# Patient Record
Sex: Female | Born: 1965 | Race: White | Hispanic: No | Marital: Married | Smoking: Current every day smoker
Health system: Southern US, Community
[De-identification: ages and names within clinical notes are randomized; demographics above are authoritative.]

## PROBLEM LIST (undated history)

## (undated) DIAGNOSIS — M1990 Unspecified osteoarthritis, unspecified site: Secondary | ICD-10-CM

## (undated) HISTORY — DX: Unspecified osteoarthritis, unspecified site: M19.90

---

## 2001-01-20 ENCOUNTER — Other Ambulatory Visit: Admission: RE | Admit: 2001-01-20 | Discharge: 2001-01-20 | Payer: Self-pay | Admitting: Obstetrics and Gynecology

## 2002-08-27 ENCOUNTER — Other Ambulatory Visit: Admission: RE | Admit: 2002-08-27 | Discharge: 2002-08-27 | Payer: Self-pay | Admitting: Obstetrics and Gynecology

## 2005-05-08 ENCOUNTER — Other Ambulatory Visit: Admission: RE | Admit: 2005-05-08 | Discharge: 2005-05-08 | Payer: Self-pay | Admitting: Obstetrics and Gynecology

## 2006-08-20 ENCOUNTER — Ambulatory Visit (HOSPITAL_COMMUNITY): Admission: RE | Admit: 2006-08-20 | Discharge: 2006-08-21 | Payer: Self-pay | Admitting: Obstetrics and Gynecology

## 2006-08-20 ENCOUNTER — Encounter (INDEPENDENT_AMBULATORY_CARE_PROVIDER_SITE_OTHER): Payer: Self-pay | Admitting: Obstetrics and Gynecology

## 2006-08-20 HISTORY — PX: ABDOMINAL HYSTERECTOMY: SHX81

## 2006-08-20 LAB — HM PAP SMEAR

## 2007-01-07 ENCOUNTER — Encounter (INDEPENDENT_AMBULATORY_CARE_PROVIDER_SITE_OTHER): Payer: Self-pay | Admitting: Internal Medicine

## 2007-01-08 ENCOUNTER — Ambulatory Visit: Payer: Self-pay | Admitting: Family Medicine

## 2007-01-08 ENCOUNTER — Encounter (INDEPENDENT_AMBULATORY_CARE_PROVIDER_SITE_OTHER): Payer: Self-pay | Admitting: Internal Medicine

## 2007-01-08 DIAGNOSIS — M7918 Myalgia, other site: Secondary | ICD-10-CM | POA: Insufficient documentation

## 2007-01-12 LAB — CONVERTED CEMR LAB
Anti Nuclear Antibody(ANA): NEGATIVE
BUN: 12 mg/dL (ref 6–23)
Basophils Absolute: 0.1 10*3/uL (ref 0.0–0.1)
Basophils Relative: 1 % (ref 0.0–1.0)
CO2: 30 meq/L (ref 19–32)
Chloride: 104 meq/L (ref 96–112)
GFR calc Af Amer: 118 mL/min
GFR calc non Af Amer: 98 mL/min
Glucose, Bld: 87 mg/dL (ref 70–99)
MCV: 85.6 fL (ref 78.0–100.0)
Monocytes Absolute: 0.7 10*3/uL (ref 0.2–0.7)
Monocytes Relative: 8 % (ref 3.0–11.0)
Potassium: 4.6 meq/L (ref 3.5–5.1)
RBC: 4.63 M/uL (ref 3.87–5.11)
Rhuematoid fact SerPl-aCnc: <20 intl units/mL — ABNORMAL LOW (ref 0.0–20.0)
Sed Rate: 20 mm/hr (ref 0–25)
Sodium: 141 meq/L (ref 135–145)

## 2007-02-02 ENCOUNTER — Ambulatory Visit: Payer: Self-pay | Admitting: Family Medicine

## 2007-02-02 LAB — CONVERTED CEMR LAB: Glucose, Urine, Semiquant: NEGATIVE

## 2009-01-06 ENCOUNTER — Ambulatory Visit: Payer: Self-pay | Admitting: Family Medicine

## 2009-01-06 LAB — CONVERTED CEMR LAB
Hemoglobin: 15.3 g/dL — ABNORMAL HIGH (ref 12.0–15.0)
MCHC: 35.3 g/dL (ref 30.0–36.0)
Neutrophils Relative %: 66.7 % (ref 43.0–77.0)
Platelets: 292 10*3/uL (ref 150.0–400.0)
RBC: 4.79 M/uL (ref 3.87–5.11)
TSH: 0.86 microintl units/mL (ref 0.35–5.50)

## 2009-02-03 ENCOUNTER — Encounter: Payer: Self-pay | Admitting: Family Medicine

## 2009-03-07 ENCOUNTER — Encounter (INDEPENDENT_AMBULATORY_CARE_PROVIDER_SITE_OTHER): Payer: Self-pay | Admitting: Internal Medicine

## 2009-03-24 ENCOUNTER — Ambulatory Visit: Payer: Self-pay | Admitting: Family Medicine

## 2009-03-24 DIAGNOSIS — L259 Unspecified contact dermatitis, unspecified cause: Secondary | ICD-10-CM | POA: Insufficient documentation

## 2009-05-18 ENCOUNTER — Encounter: Payer: Self-pay | Admitting: Family Medicine

## 2009-05-18 ENCOUNTER — Ambulatory Visit (HOSPITAL_COMMUNITY): Admission: RE | Admit: 2009-05-18 | Discharge: 2009-05-18 | Payer: Self-pay | Admitting: Rheumatology

## 2010-02-21 ENCOUNTER — Encounter: Payer: Self-pay | Admitting: Family Medicine

## 2010-04-17 NOTE — Letter (Signed)
Summary: [facility]  [facility]   Imported By: Lanelle Bal 03/20/2009 10:43:34  _____________________________________________________________________  External Attachment:    Type:   Image     Comment:   External Document

## 2010-04-17 NOTE — Letter (Signed)
Summary: [facility]  [facility]   Imported By: Lanelle Bal 05/30/2009 08:19:41  _____________________________________________________________________  External Attachment:    Type:   Image     Comment:   External Document

## 2010-04-17 NOTE — Assessment & Plan Note (Signed)
Summary: ?ALLERGIC REACTION/CLE   Vital Signs:  Patient profile:   45 year old female Height:      65 inches Weight:      207.75 pounds BMI:     34.70 Temp:     98.4 degrees F oral Pulse rate:   80 / minute Pulse rhythm:   regular BP sitting:   110 / 76  (left arm) Cuff size:   large  Vitals Entered By: Lewanda Rife LPN (March 24, 2009 10:21 AM)  CC:  ?allergic reaction. Started itching on 03/18/09 and stopped all meds..  History of Present Illness: Here for ? reaction to something, not sure what--onset since 03/18/2009--taking benadryl 25 at hs, helps some --rash all over that itches  Was started on Plaquinil and low dose prednisone for RA on 12/21 by Dr Corliss Skains, rheumatologist  She started old cipro for UTI sx--took x 3d--12/26-12/30/2010--break out started when taking cipro  new comforter--not feathers--started using 12/26 --new fabric softner  Allergies: 1)  ! Codeine Sulfate (Codeine Sulfate)  Review of Systems      See HPI  Physical Exam  General:  alert, well-developed, well-nourished, and well-hydrated.  NAD Skin:  eryth fine red rash on trunk, anterior and posterior,and arms, legs, neck--not on face --evidence of scratching on trunk and arms Psych:  normally interactive and slightly anxious.     Impression & Recommendations:  Problem # 1:  CONTACT DERMATITIS&OTHER ECZEMA DUE UNSPEC CAUSE (ICD-692.9) Assessment New etiology of rash is not clear--many variables: cipro, plaquinil, new linins will start on prednisone taper: 60x1, 50x1, 40x3, 30x3, 20x3days she is to call Dr Corliss Skains re meds see back if not improved in 5d will mark chart allergic to cipro--this may not be true, but not worth repeat of this reaction to remove all bed linens and make bed without new comforter--may try in a month when other variables reduced--understands Her updated medication list for this problem includes:    Prednisone 5 Mg Tabs (Prednisone) ..... One daily for three days  then 1/2 daily for 3 days    Benadryl 25 Mg Caps (Diphenhydramine hcl) ..... Otc as directed.    Prednisone 10 Mg Tabs (Prednisone) .Marland Kitchen... Ake 6 tabs today, then take 5 daily x 1, then take 4 daily for 3days, then take 3 daily x 3, then take 2 daily x 2  Problem # 2:  MYOSITIS (ICD-729.1) Assessment: Comment Only now under cate of Dr Corliss Skains, rheumatologist, she feels that she has RA and is being treated accordingly The following medications were removed from the medication list:    Naprosyn 250 Mg Tabs (Naproxen) ..... Otc as needed  Complete Medication List: 1)  Hydroxychloroquine Sulfate 200 Mg Tabs (Hydroxychloroquine sulfate) .... One tablet twice a day 2)  Ciprofloxacin Hcl 500 Mg Tabs (Ciprofloxacin hcl) .... One tablet twice a day for 7 days 3)  Prednisone 5 Mg Tabs (Prednisone) .... One daily for three days then 1/2 daily for 3 days 4)  Benadryl 25 Mg Caps (Diphenhydramine hcl) .... Otc as directed. 5)  Prednisone 10 Mg Tabs (Prednisone) .... Ake 6 tabs today, then take 5 daily x 1, then take 4 daily for 3days, then take 3 daily x 3, then take 2 daily x 2 Prescriptions: PREDNISONE 10 MG TABS (PREDNISONE) ake 6 tabs today, then take 5 daily x 1, then take 4 daily for 3days, then take 3 daily x 3, then take 2 daily x 2  #36 x 0   Entered and Authorized by:  Billie-Lynn Tyler Deis FNP   Signed by:   Gildardo Griffes FNP on 03/24/2009   Method used:   Electronically to        [facility] (retail)       [address]       [place], [place]  [address]       Ph: [phone]       Fax: [phone]   RxID:   [id number]   Current Allergies (reviewed today): ! CODEINE SULFATE (CODEINE SULFATE)

## 2010-04-19 NOTE — Letter (Signed)
Summary: [facility]  [facility]   Imported By: Lanelle Bal 03/27/2010 10:47:03  _____________________________________________________________________  External Attachment:    Type:   Image     Comment:   External Document

## 2010-08-03 NOTE — Discharge Summary (Signed)
[name], Mary Shaffer                 ACCOUNT NO.:  [id number]   MEDICAL RECORD NO.:  [id number]          PATIENT TYPE:  OIB   LOCATION:  9317                          FACILITY:  [facility]   PHYSICIAN:  Juluis Mire, M.D.   DATE OF BIRTH:  09/14/1965   DATE OF ADMISSION:  08/20/2006  DATE OF DISCHARGE:  08/21/2006                               DISCHARGE SUMMARY   ADMISSION DIAGNOSIS:  Abnormal uterine bleeding.   DISCHARGE DIAGNOSIS:  Abnormal uterine bleeding, with the addition of  pelvic adhesions.   PROCEDURE:  Laparoscopic-assisted vaginal hysterectomy with lysis of  adhesions and cystoscopy.  For complete history and physical, see  dictated note.   COURSE IN THE HOSPITAL:  The patient underwent the above-noted surgery.  At the time of laparoscopy, she did have adhesions between the ovaries  and the back of the uterus; these were freed up.  After we completed the  laparoscopic-assisted vaginal hysterectomy, did do cystoscopy to  evaluate the bladder and ureters.  There was no signs of injuries, and  both ureters were spilling blue-tinged urine.   Postoperatively did well.  Discharged home on first postoperative day.  At that time, she was afebrile with stable vital signs.  Abdomen soft,  nontender.  Bowel sounds were active.  All incisions were intact.  Her  Foley had been discontinued.  She was voiding without difficulty.  She  had no active vaginal bleeding.  She was tolerating a regular diet and  ambulating without difficulty.  Her hemoglobin was 12.1.   In terms of complications, none encountered during stay in hospital.  The patient discharged home in stable condition.   DISPOSITION:  Routine postoperative instructions were given.  She was to  avoid heavy lifting, vaginal entrance or driving a car.  She was to  watch for signs of infection, nausea, vomiting, increasing abdominal  pain, active vaginal bleeding, signs of deep venous thrombosis or  pulmonary embolus.  She will  be followed in the office in one week.  Medications at discharge is Tylox.      Juluis Mire, M.D.  Electronically Signed     JSM/MEDQ  D:  08/21/2006  T:  08/21/2006  Job:  [id number]

## 2010-08-03 NOTE — Op Note (Signed)
NAMEJOHNELL, LANDOWSKI                 ACCOUNT NO.:  [id number]   MEDICAL RECORD NO.:  [id number]          PATIENT TYPE:  AMB   LOCATION:  SDC                           FACILITY:  [facility]   PHYSICIAN:  Juluis Mire, M.D.   DATE OF BIRTH:  Mar 03, 1966   DATE OF PROCEDURE:  DATE OF DISCHARGE:                               OPERATIVE REPORT   PREOPERATIVE DIAGNOSES:  Abnormal uterine bleeding.   POSTOPERATIVE DIAGNOSES:  Abnormal uterine bleeding with pelvic  adhesions.   PROCEDURE:  1. Laparoscopic assisted vaginal hysterectomy.  2. Lysis of adhesions.  3. Cystoscopy.   SURGEON:  Juluis Mire, M.D.   ASSISTANT:  Duke Salvia. Marcelle Overlie, M.D.   ANESTHESIA:  General endotracheal.   ESTIMATED BLOOD LOSS:  300-400 cc.   PACKS AND DRAINS:  None.   INTRAOPERATIVE BLOOD REPLACED:  None.   COMPLICATIONS:  None.   INDICATIONS FOR PROCEDURE:  As noted in history and physical.   PROCEDURE AS FOLLOWS:  The patient was taken to the OR and placed in  supine position.  After adequate level of general endotracheal  anesthesia was obtained, the patient was placed dorsal lithotomy  position using the Allen stirrups.  Peritoneum and vagina prepped out  with Betadine and bladder was entered by catheterization.  Hulka  tenaculum was put in place and secured.  The patient was then draped in  sterile fields.  Subumbilical incision was made with a knife.  A Veress  needle was introduced into the abdominal cavity.  Air was inflated with  approximately 40 liters of carbon dioxide.  The operating laparoscope  was introduced.  There was no evidence of injury to adjacent organs.  A  5 mm trocar was then placed in the suprapubic area under direct  visualization.  Visualization revealed a normal appendix, upper abdomen  including liver and tip of the gallbladder were clear.  The uterus was  adherent posteriorly to both ovaries that were stuck along the back of  the uterus and the left one was somewhat adherent  to the left pelvic  sidewall.  The ovaries and tubes are otherwise unremarkable.  We put in  a 2nd, 5 mm trocar in the left lower quadrant after we visualized the  epigastric vessels. The uterus was then elevated.  Using sharp and some  blunt dissection we are able to free the ovaries from the attachment to  the uterus.  The right one was __INTACT________ and freed and the left  one still had some adhesions to the pelvic sidewall but we left those  intact.  At this point in time, we brought in the GYRUS.  The right  utero-ovarian pedicle was cauterized, incised; the right round ligament  was cauterized, incised.  Then the left utero-ovarian pedicle was  cauterized and incised and the left round ligament was cauterized and  incised.  We had good relief bilaterally and we had good hemostasis. It  was noted that the bladder was densely adherent to the lower uterine  segment, at least it looked that way laparoscopically.   At this point in time  the laparoscope was removed.  The abdomen was  deflated with carbon dioxide. The legs were repositioned.  The Hulka  tenaculum was then removed.  The weight speculum was placed in the  vaginal vault.  Cervix was grasped with Jacob's tenaculum. Cul de sac  was entered sharply. Both uterosacral ligaments were clamped, cut and  suture ligated with 0-Vicryl.  The reflection of the vaginal mucosa  anteriorly was incised and the bladder was dissected superiorly.  Paracervical tissue was clamped, cut and suture ligated with 0-Vicryl.  Using the clamp, cut and tie technique we sutured the gutters with 0-  Vicryl. The parametrium was serially separated from the side of the  uterus.  We were able eventually identify the vesico-uterine space and  entered the peritoneum and put a retractor in place.  The uterus was  then flipped. The remaining pedicles were clamped and cut. The uterus  and cervix were passed off the operative field and sent to pathology.  Held  pedicles were secured with free ties of 0-Vicryl.  A uterosacral  plication stitch of 0-Vicryl was put in place and secured.  The vaginal  mucosa was reapproximated in vertical fashion after interrupted figure  of 8's of  0-Monocryl.  Because of the adhesions and the dissection we decided to  cystoscope the patient. She was given Indigo-Carmine.  The cystoscope  was introduced.  Both ureteral orifices were identified and noted to be  spilling blue tinged urine.  She did have some areas of mucosal  disruption posteriorly but it did not appear to go all the way through  the bladder. This may have been during the blunt dissection.  We  visualized and there was no spilling of blue tinged dye into the pelvis  or into the vaginal mucosa.  Again, we cold visualize easily and this  was just little tears in the mucosa.  At this point in time, the  cystoscope was removed. Foley was placed with straight drain.  The  patient's legs were positioned.  Laparoscope was re-introduced.  Visualization revealed actually excellent hemostasis at cuff and  ovaries.  We thoroughly irrigated the pelvis.  At this point in time,  all ligation was removed.  The abdomen was deflated of its carbon  dioxide.  All trocars were removed.  Subumbilical incision was closed  with interrupted,  subarticulars 4-0 Vicryl,  suprapubic incisions  closed with Dermabond.  The patient was taken out of the dorsal  lithotomy position.  Once alert and extubated transferred to recovery  room in good condition.  Sponge, instrument and needle counts reported  as correct by circulating nurse x2.      Juluis Mire, M.D.  Electronically Signed     JSM/MEDQ  D:  08/20/2006  T:  08/20/2006  Job:  [id number]

## 2010-08-03 NOTE — H&P (Signed)
NAMEMARLYN, Mary Shaffer                 ACCOUNT NO.:  [id number]   MEDICAL RECORD NO.:  [id number]          PATIENT TYPE:  AMB   LOCATION:  SDC                           [facility]ACILITY:  [facility]   PHYSICIAN:  Juluis Mire, M.D.   DATE OF BIRTH:  1966-03-07   DATE OF ADMISSION:  08/20/2006  DATE OF DISCHARGE:                              HISTORY & PHYSICAL   The patient is a 45 year old gravida 1, para 1 female who presents for  laparoscopic-assisted vaginal hysterectomy.   In relationship to the present admission, the patient has had increasing  menstrual  irregularities with associated menorrhagia.  Cycle pattern  and amount of flow has become a problem for the patient.  We are unable  to use birth control pills as she does continue to  have tobacco use.  We did a saline-infusion ultrasound and endometrial biopsy, both of  which were negative.  She did have some findings suggestive of  adenomyosis.  We tried cycling with Prometrium which we were  unsuccessful with and she now presents for laparoscopic-assisted vaginal  hysterectomy.  Ablation would be contraindicated in view of  irregularity.   ALLERGIES:  ALLERGIC TO CODEINE.   MEDICATIONS:  None.   PAST MEDICAL HISTORY:  Usual childhood diseases.  No significant  sequelae.   PAST SURGICAL HISTORY:  She has had one cesarean section.   FAMILY HISTORY:  Noncontributory.   SOCIAL HISTORY:  Social history does reveal 1-1/2 packs per day tobacco  use for the past 20 years.  No alcohol use.   REVIEW OF SYSTEMS:  Noncontributory.   PHYSICAL EXAMINATION:  VITAL SIGNS:  On physical exam, the patient is  afebrile with stable vital signs.  HEENT:  On exam, the patient is normocephalic.  Pupils equal, round, and  reactive to light and accommodation.  Extraocular movements were intact.  Sclerae and conjunctivae are clear.  Oropharynx clear.  NECK:  Without thyromegaly.  BREASTS:  No discrete masses.  LUNGS:  Clear.  CARDIAC SYSTEM:  Regular  in rate without murmurs or gallops.  ABDOMINAL EXAM:  Benign; no mass, organomegaly, or tenderness.  PELVIC:  Normal external genitalia.  Vaginal mucosa is clear.  Cervix  unremarkable.  Uterus feels to be normal size, shape, and contour.  Adnexa free of masses or tenderness.  EXTREMITIES:  Trace edema.  NEUROLOGIC:  Exam is grossly within normal limits.   IMPRESSION:  Abnormal uterine bleeding with associated menorrhagia  unresponsive to conservative management.   PLAN:  Again, we have discussed options.  We could continue to try to  cycle progesterone agents.  I have reviewed her tobacco use.  Her  hormonal options are limited.  Ablation is limited due to irregularity.  Therefore, we will proceed with laparoscopic-assisted vaginal  hysterectomy.  The risks of surgery were explained including the risk of  infection.  The risk of hemorrhage that could require transfusion with  the risk of AIDS or hepatitis.  The risk of injury to adjacent organs  including bladder, bowel, or ureters that could require further  exploratory surgery.  Risk of deep venous  thrombosis and pulmonary  embolus.  The patient expressed understanding of indications and risks.      Juluis Mire, M.D.  Electronically Signed     JSM/MEDQ  D:  08/20/2006  T:  08/20/2006  Job:  [id number]

## 2011-01-03 LAB — CBC
HCT: 38.5
Hemoglobin: 13
MCHC: 33.9
MCHC: 34.8
MCV: 88.9
Platelets: 293
RBC: 4.39
RDW: 13.1
RDW: 13.3
WBC: 16.3 — ABNORMAL HIGH
WBC: 8.4

## 2011-01-03 LAB — HCG, SERUM, QUALITATIVE: Preg, Serum: NEGATIVE

## 2013-08-03 ENCOUNTER — Other Ambulatory Visit: Payer: Self-pay | Admitting: Dermatology

## 2015-08-17 ENCOUNTER — Ambulatory Visit (INDEPENDENT_AMBULATORY_CARE_PROVIDER_SITE_OTHER): Payer: Commercial Managed Care - HMO | Admitting: Family Medicine

## 2015-08-17 ENCOUNTER — Encounter: Payer: Self-pay | Admitting: Family Medicine

## 2015-08-17 VITALS — BP 120/79 | HR 55 | Ht 65.25 in | Wt 211.4 lb

## 2015-08-17 DIAGNOSIS — E669 Obesity, unspecified: Secondary | ICD-10-CM | POA: Diagnosis not present

## 2015-08-17 DIAGNOSIS — Z720 Tobacco use: Secondary | ICD-10-CM | POA: Insufficient documentation

## 2015-08-17 DIAGNOSIS — Z1231 Encounter for screening mammogram for malignant neoplasm of breast: Secondary | ICD-10-CM

## 2015-08-17 DIAGNOSIS — M069 Rheumatoid arthritis, unspecified: Secondary | ICD-10-CM | POA: Diagnosis not present

## 2015-08-17 DIAGNOSIS — E663 Overweight: Secondary | ICD-10-CM | POA: Insufficient documentation

## 2015-08-17 DIAGNOSIS — Z716 Tobacco abuse counseling: Secondary | ICD-10-CM | POA: Diagnosis not present

## 2015-08-17 NOTE — Progress Notes (Signed)
Mary Shaffer, D.O. Family Medicine Physician [facility] Location: Primary Care at [facility]     Subjective:    CC: New pt, here to establish care.   HPI: Mary Shaffer is a pleasant 49 y.o. female who presents to [facility] at [facility] today To become established.  Her primary concern today is her weight.  She says she is the heaviest she has ever been and this makes her very unhappy. Approximately 8 years ago she had lost a bunch of weight by cutting back on the wrong foods and moving more. She wishes to do the same and wants to be held accountable.  She admittedly drinks tons of regular Dr. Reino Kent and a lot of fried and fast foods.  RA: Had seen Dr. Lendon Colonel in the past. She was on methotrexate, folic acid, meloxicam but felt that that made her feel worse then she does now and even have more flares. She has not seen a rheumatologist for over 2 years now. Her symptoms are well controlled per patient and she only gets flares one time per week or even some times once a month.  She uses Advil when necessary during these times at appropriate doses which works well.  She has not been exercising and says that in the past this was one of the most important thing she did to make herself feel better.  Past Medical History  Diagnosis Date  . Arthritis     rheumatoid    Past Surgical History  Procedure Laterality Date  . Abdominal hysterectomy  08/20/2006    Family History  Problem Relation Age of Onset  . Alcohol abuse Brother   . Hypertension Son   . Hypertension Maternal Grandmother   . Diabetes Maternal Grandfather   . Rheum arthritis Paternal Grandmother     History  Drug Use No  ,  History  Alcohol Use No  ,  History  Smoking status  . Current Every Day Smoker -- 1.00 packs/day  . Types: Cigarettes  Smokeless tobacco  . Never Used  ,  History  Sexual Activity  . Sexual Activity: Yes    Comment: hysterectomy    Patient's  Medications  New Prescriptions   No medications on file  Previous Medications   IBUPROFEN (ADVIL,MOTRIN) 200 MG TABLET    Take 400 mg by mouth every 6 (six) hours as needed.  Modified Medications   No medications on file  Discontinued Medications   No medications on file    ALLERGIES: Codeine sulfate and Sulfa antibiotics   Review of Systems: Full 14 point ROS performed via "adult medical history form".  Negative except for noted above    Objective:   Blood pressure 120/79, pulse 55, height 5' 5.25" (1.657 m), weight 211 lb 6.4 oz (95.89 kg). Body mass index is 34.92 kg/(m^2).  General: Well Developed, well nourished, and in no acute distress.  Neuro: Alert and oriented x3, extra-ocular muscles intact, sensation grossly intact.  HEENT: Normocephalic, atraumatic, pupils equal round reactive to light, neck supple, no gross masses, no carotid bruits, no JVD apprec Skin: no gross suspicious lesions or rashes  Cardiac: Regular rate and rhythm, no murmurs rubs or gallops.  Respiratory: Essentially clear to auscultation bilaterally. Not using accessory muscles, speaking in full sentences.  Abdominal: Soft, not grossly distended Musculoskeletal: Ambulates w/o diff, FROM * 4 ext.  Vasc: less 2 sec cap RF, warm and pink  Psych:  No HI/SI, judgement and insight good.  Impression and Recommendations:    The patient was counselled, risk factors were discussed, anticipatory guidance given.  No problem-specific assessment & plan notes found for this encounter.  Pt was seen in the office today for 45+ minutes, with over 50% time spent in face the face counseling and coordination of care  Rheumatoid arthritis (HCC) Cc Dr. Lendon Colonel but has not been there in over 2 years. Since symptoms well-controlled now we will manage with Advil and exercise. If gets worse patient understands she will need to return to the rheumatologist.  Tobacco abuse Patient without interest in quitting at this  time.  Obesity (BMI 30-39.9) Extensive counseling    Obesity:  Pt's goal- cut out caloric drinks, quit breads, fried foods, sweets.  Exercise- Walking T,TH,Sat- * 1 wk then next week    Extensive weight loss counseling including dietary changes and physical activity.  Follow-up 1 month for weight loss.  HealthMaintenance:  CBC, CMP, fasting lipid profile, TSH, A1c, vitamin D, B12 in the near future.  Patient wishes to have a complete physical exam in the very near future as it's been many years since her last one.  Explained that we like to focus on health maintenance issues during her yearly physical and not focus on her chronic diseases.   Note: This document was prepared using Dragon voice recognition software and may include unintentional dictation errors.

## 2015-08-17 NOTE — Assessment & Plan Note (Signed)
Patient without interest in quitting at this time.

## 2015-08-17 NOTE — Assessment & Plan Note (Signed)
Extensive counseling.   

## 2015-08-17 NOTE — Patient Instructions (Addendum)
Pt's goal- cut out caloric drinks, quit breads, fried foods, sweets.  Exercise- Walking T,TH,Sat- * 1 wk then next week.  F/up 1 mo.    -->  Obesity Obesity is defined as having too much total body fat and a body mass index (BMI) of 30 or more. BMI is an estimate of body fat and is calculated from your height and weight. BMI is typically calculated by your health care provider during regular wellness visits. Obesity happens when you consume more calories than you can burn by exercising or performing daily physical tasks. Prolonged obesity can cause major illnesses or emergencies, such as:  Stroke.  Heart disease.  Diabetes.  Cancer.  Arthritis.  High blood pressure (hypertension).  High cholesterol.  Sleep apnea.  Erectile dysfunction.  Infertility problems. CAUSES   Regularly eating unhealthy foods.  Physical inactivity.  Certain disorders, such as an underactive thyroid (hypothyroidism), Cushing's syndrome, and polycystic ovarian syndrome.  Certain medicines, such as steroids, some depression medicines, and antipsychotics.  Genetics.  Lack of sleep. DIAGNOSIS A health care provider can diagnose obesity after calculating your BMI. Obesity will be diagnosed if your BMI is 30 or higher. There are other methods of measuring obesity levels. Some other methods include measuring your skinfold thickness, your waist circumference, and comparing your hip circumference to your waist circumference. TREATMENT  A healthy treatment program includes some or all of the following:  Long-term dietary changes.  Exercise and physical activity.  Behavioral and lifestyle changes.  Medicine only under the supervision of your health care provider. Medicines may help, but only if they are used with diet and exercise programs. If your BMI is 40 or higher, your health care provider may recommend specialized surgery or programs to help with weight loss. An unhealthy treatment  program includes:  Fasting.  Fad diets.  Supplements and drugs. These choices do not succeed in long-term weight control. HOME CARE INSTRUCTIONS  Exercise and perform physical activity as directed by your health care provider. To increase physical activity, try the following:  Use stairs instead of elevators.  Park farther away from store entrances.  Garden, bike, or walk instead of watching television or using the computer.  Eat healthy, low-calorie foods and drinks on a regular basis. Eat more fruits and vegetables. Use low-calorie cookbooks or take healthy cooking classes.  Limit fast food, sweets, and processed snack foods.  Eat smaller portions.  Keep a daily journal of everything you eat. There are many free websites to help you with this. It may be helpful to measure your foods so you can determine if you are eating the correct portion sizes.  Avoid drinking alcohol. Drink more water and drinks without calories.  Take vitamins and supplements only as recommended by your health care provider.  Weight-loss support groups, Government social research officer, counselors, and stress reduction education can also be very helpful. SEEK IMMEDIATE MEDICAL CARE IF:  You have chest pain or tightness.  You have trouble breathing or feel short of breath.  You have weakness or leg numbness.  You feel confused or have trouble talking.  You have sudden changes in your vision.   This information is not intended to replace advice given to you by your health care provider. Make sure you discuss any questions you have with your health care provider.   Document Released: 04/11/2004 Document Revised: 03/25/2014 Document Reviewed: 04/10/2011 Elsevier Interactive Patient Education 2016 [employer]. Eating Healthy on a Budget There are many ways to save money at  the grocery store and continue to eat healthy. You can be successful if you plan your meals according to your budget, purchase according  to your budget and grocery list, and prepare food yourself.  How can I buy more food on a limited budget? Plan  Plan meals and snacks according to a grocery list and budget you create.  Look for recipes where you can cook once and make enough food for two meals.  Include meals that will "stretch" more expensive foods such as stews, casseroles, and stir-fry dishes.  Make a grocery list and make sure to bring it with you to the store. If you have a smart phone, you could use your phone to create your shopping list. Purchase  When grocery shopping, buy only the items on your grocery list and go only to the areas of the store that have the items on your list. Prepare  Some meal items can be prepared in advance. Pre-cook on days when you have extra time.  Make extra food (such as by doubling recipes) and freeze the extras in meal-sized containers or in individual portions for fast meals and snacks.  Use leftovers in your meal plan for the week.  Try some meatless meals or try "no cook" meals like salads.  When you come home from the grocery store, wash and prepare your fruits and vegetables so they are ready to use and eat. This will help reduce food waste. How can I buy more food on a limited budget?  Try these tips the next time you go shopping:   [place] store brands or generic brands.  Use coupons only for foods and brands you normally buy. Avoid buying items you wouldn't normally buy simply because they are on sale.  Check online and in newspapers for weekly deals.  Buy healthy items from the bulk bins when available, such as herbs, spices, flours, pastas, nuts, and dried fruit.  Buy fruits and vegetables that are in season. Prices are usually lower on in-season produce.  Compare and contrast different items. You can do this by looking at the unit price on the price tag. Use it to compare different brands and sizes to find out which item is the best deal.  Choose naturally  low-cost healthy items, such as carrots, potatoes, apples, bananas, and oranges. Dried or canned beans are a low-cost protein source.  Buy in bulk and freeze extra food. Items you can buy in bulk include meats, fish, poultry, frozen fruits, and frozen vegetables.  Limit the purchase of prepared or "ready-to-eat" foods, such as pre-cut fruits and vegetables and pre-made salads.  If possible, shop around to discover which grocery store offers the best prices. Some stores charge much more than other stores for the same items.  Do not shop when you are hungry. If you shop while hungry, It may be hard to stick to your list and budget.  Stick to your list and resist impulse buys. Treat your list as your official plan for the week.  Buy a variety of vegetables and fruit by purchasing fresh, frozen, and canned items.  Look beyond eye level. Foods at eye level (adult or child eye level) are more expensive. Look at the top and bottom shelves for deals.  Be efficient with your time when shopping. The more time you spend at the store, the more money you are likely to spend.  Consider other retailers such as dollar stores, larger [employer], local fruit and vegetable stands, and farmers markets.  What are some tips for less expensive food substitutions? When choosing more expensive foods like meats and dairy, try these tips to save money:  Choose cheaper cuts of meat, such as bone-in chicken thighs and drumsticks instead skinless and boneless chicken. When you are ready to prepare the chicken, you can remove the skin yourself to make it healthier.  Choose lean meats like chicken or [place]. When choosing ground beef, make sure it is lean ground beef (92% lean, 8% fat). If you do buy a fattier ground beef, drain the fat before eating.  Buy dried beans and peas, such as lentils, split peas, or kidney beans.  For seafood, choose canned tuna, salmon, or sardines.  Eggs are a low-cost source of  protein.  Buy the larger tubs of yogurt instead of individual-sized containers.  Choose water instead of sodas and other sweetened beverages.  Skip buying chips, cookies, and other "junk food". These items are usually expensive, high in calories, and low in nutritional value. How can I prepare the foods I buy in the healthiest way? Practice these tips for cooking foods in the healthiest way to reduce excess fat and calorie intake:  Steam, saute, grill, or bake foods instead of frying them.  Make sure half your plate is filled with fruits or vegetables. Choose from fresh, frozen, or canned fruits and vegetables. If eating canned, remember to rinse them before eating. This will remove any excess salt added for packaging.  Trim all fat from meat before cooking. Remove the skin from chicken or [place].  Spoon off fat from meat dishes once they have been chilled in the refrigerator and the fat has hardened on the top.  Use skim milk, low-fat milk, or evaporated skim milk when making cream sauces, soups, or puddings.  Substitute low-fat yogurt, sour cream, or cottage cheese for sour cream and mayonnaise in dips and dressings.  Try lemon juice, herbs, or spices to season food instead of salt, butter, or margarine.   This information is not intended to replace advice given to you by your health care provider. Make sure you discuss any questions you have with your health care provider.   Document Released: 11/05/2013 Document Reviewed: 11/05/2013 Elsevier Interactive Patient Education [employer]  Exercising to [employer] Exercising regularly is important. It has many health benefits, such as:  Improving your overall fitness, flexibility, and endurance.  Increasing your bone density.  Helping with weight control.  Decreasing your body fat.  Increasing your muscle strength.  Reducing stress and tension.  Improving your overall health. In order to become healthy and stay  healthy, it is recommended that you do moderate-intensity and vigorous-intensity exercise. You can tell that you are exercising at a moderate intensity if you have a higher heart rate and faster breathing, but you are still able to hold a conversation. You can tell that you are exercising at a vigorous intensity if you are breathing much harder and faster and cannot hold a conversation while exercising. HOW OFTEN SHOULD I EXERCISE? Choose an activity that you enjoy and set realistic goals. Your health care provider can help you to make an activity plan that works for you. Exercise regularly as directed by your health care provider. This may include:   Doing resistance training twice each week, such as:  Push-ups.  Sit-ups.  Lifting weights.  Using resistance bands.  Doing a given intensity of exercise for a given amount of time. Choose from these options:  150 minutes of moderate-intensity  exercise every week.  75 minutes of vigorous-intensity exercise every week.  A mix of moderate-intensity and vigorous-intensity exercise every week. Children, pregnant women, people who are out of shape, people who are overweight, and older adults may need to consult a health care provider for individual recommendations. If you have any sort of medical condition, be sure to consult your health care provider before starting a new exercise program.  WHAT ARE SOME EXERCISE IDEAS? Some moderate-intensity exercise ideas include:   Walking at a rate of 1 mile in 15 minutes.  Biking.  Hiking.  Golfing.  Dancing. Some vigorous-intensity exercise ideas include:   Walking at a rate of at least 4.5 miles per hour.  Jogging or running at a rate of 5 miles per hour.  Biking at a rate of at least 10 miles per hour.  Lap swimming.  Roller-skating or in-line skating.  Cross-country skiing.  Vigorous competitive sports, such as football, basketball, and soccer.  Jumping rope.  Aerobic  dancing. WHAT ARE SOME EVERYDAY ACTIVITIES THAT CAN HELP ME TO GET EXERCISE?  Yard work, such as:  Child psychotherapist.  Raking and bagging leaves.  Washing and waxing your car.  Pushing a stroller.  Shoveling snow.  Gardening.  Washing windows or floors. HOW CAN I BE MORE ACTIVE IN MY DAY-TO-DAY ACTIVITIES?  Use the stairs instead of the elevator.  Take a walk during your lunch break.  If you drive, park your car farther away from work or school.  If you take public transportation, get off one stop early and walk the rest of the way.  Make all of your phone calls while standing up and walking around.  Get up, stretch, and walk around every 30 minutes throughout the day. WHAT GUIDELINES SHOULD I FOLLOW WHILE EXERCISING?  Do not exercise so much that you hurt yourself, feel dizzy, or get very short of breath.  Consult your health care provider before starting a new exercise program.  Wear comfortable clothes and shoes with good support.  Drink plenty of water while you exercise to prevent dehydration or heat stroke. Body water is lost during exercise and must be replaced.  Work out until you breathe faster and your heart beats faster.   This information is not intended to replace advice given to you by your health care provider. Make sure you discuss any questions you have with your health care provider.   Document Released: 04/06/2010 Document Revised: 03/25/2014 Document Reviewed: 08/05/2013 Elsevier Interactive Patient Education [employer]

## 2015-08-17 NOTE — Assessment & Plan Note (Signed)
Cc Dr. Lendon Colonel but has not been there in over 2 years. Since symptoms well-controlled now we will manage with Advil and exercise. If gets worse patient understands she will need to return to the rheumatologist.

## 2015-08-22 NOTE — Addendum Note (Signed)
Addended by: Stan Head on: 08/22/2015 01:52 PM   Modules accepted: Orders

## 2015-09-05 ENCOUNTER — Other Ambulatory Visit (INDEPENDENT_AMBULATORY_CARE_PROVIDER_SITE_OTHER): Payer: Commercial Managed Care - HMO

## 2015-09-05 DIAGNOSIS — Z0000 Encounter for general adult medical examination without abnormal findings: Secondary | ICD-10-CM

## 2015-09-06 LAB — COMPREHENSIVE METABOLIC PANEL
ALBUMIN: 3.9 g/dL (ref 3.6–5.1)
ALK PHOS: 62 U/L (ref 33–130)
ALT: 11 U/L (ref 6–29)
AST: 16 U/L (ref 10–35)
BUN: 15 mg/dL (ref 7–25)
CALCIUM: 9.1 mg/dL (ref 8.6–10.4)
CO2: 27 mmol/L (ref 20–31)
Chloride: 106 mmol/L (ref 98–110)
Creat: 0.83 mg/dL (ref 0.50–1.05)
Glucose, Bld: 97 mg/dL (ref 65–99)
POTASSIUM: 5.1 mmol/L (ref 3.5–5.3)
Sodium: 140 mmol/L (ref 135–146)
TOTAL PROTEIN: 6.2 g/dL (ref 6.1–8.1)
Total Bilirubin: 0.4 mg/dL (ref 0.2–1.2)

## 2015-09-06 LAB — HEMOGLOBIN A1C
HEMOGLOBIN A1C: 5.6 % (ref <5.7)
MEAN PLASMA GLUCOSE: 114 mg/dL

## 2015-09-06 LAB — CBC WITH DIFFERENTIAL/PLATELET
BASOS ABS: 54 {cells}/uL (ref 0–200)
BASOS PCT: 1 %
EOS ABS: 108 {cells}/uL (ref 15–500)
Eosinophils Relative: 2 %
HEMATOCRIT: 43.2 % (ref 35.0–45.0)
Hemoglobin: 14.2 g/dL (ref 11.7–15.5)
LYMPHS ABS: 1458 {cells}/uL (ref 850–3900)
Lymphocytes Relative: 27 %
MCH: 29.5 pg (ref 27.0–33.0)
MCHC: 32.9 g/dL (ref 32.0–36.0)
MCV: 89.6 fL (ref 80.0–100.0)
MONO ABS: 486 {cells}/uL (ref 200–950)
MONOS PCT: 9 %
MPV: 10.2 fL (ref 7.5–12.5)
NEUTROS ABS: 3294 {cells}/uL (ref 1500–7800)
Neutrophils Relative %: 61 %
Platelets: 242 10*3/uL (ref 140–400)
RBC: 4.82 MIL/uL (ref 3.80–5.10)
RDW: 13.7 % (ref 11.0–15.0)
WBC: 5.4 10*3/uL (ref 3.8–10.8)

## 2015-09-06 LAB — LIPID PANEL
CHOL/HDL RATIO: 3.1 ratio (ref <=5.0)
Cholesterol: 160 mg/dL (ref 125–200)
HDL: 51 mg/dL (ref >=46)
LDL CALC: 87 mg/dL (ref <130)
Triglycerides: 110 mg/dL (ref <150)
VLDL: 22 mg/dL (ref <30)

## 2015-09-06 LAB — VITAMIN D 25 HYDROXY (VIT D DEFICIENCY, FRACTURES): VIT D 25 HYDROXY: 24 ng/mL — AB (ref 30–100)

## 2015-09-06 LAB — TSH: TSH: 1.41 m[IU]/L

## 2015-09-26 ENCOUNTER — Encounter: Payer: Self-pay | Admitting: Family Medicine

## 2015-09-26 ENCOUNTER — Ambulatory Visit (INDEPENDENT_AMBULATORY_CARE_PROVIDER_SITE_OTHER): Payer: Commercial Managed Care - HMO | Admitting: Family Medicine

## 2015-09-26 VITALS — BP 114/78 | HR 69 | Ht 65.25 in | Wt 206.3 lb

## 2015-09-26 DIAGNOSIS — IMO0001 Reserved for inherently not codable concepts without codable children: Secondary | ICD-10-CM

## 2015-09-26 DIAGNOSIS — Z716 Tobacco abuse counseling: Secondary | ICD-10-CM

## 2015-09-26 DIAGNOSIS — M059 Rheumatoid arthritis with rheumatoid factor, unspecified: Secondary | ICD-10-CM | POA: Diagnosis not present

## 2015-09-26 DIAGNOSIS — G894 Chronic pain syndrome: Secondary | ICD-10-CM | POA: Insufficient documentation

## 2015-09-26 DIAGNOSIS — Z720 Tobacco use: Secondary | ICD-10-CM

## 2015-09-26 DIAGNOSIS — E559 Vitamin D deficiency, unspecified: Secondary | ICD-10-CM

## 2015-09-26 DIAGNOSIS — Z90710 Acquired absence of both cervix and uterus: Secondary | ICD-10-CM

## 2015-09-26 DIAGNOSIS — F4323 Adjustment disorder with mixed anxiety and depressed mood: Secondary | ICD-10-CM | POA: Diagnosis not present

## 2015-09-26 DIAGNOSIS — M1990 Unspecified osteoarthritis, unspecified site: Secondary | ICD-10-CM

## 2015-09-26 DIAGNOSIS — E669 Obesity, unspecified: Secondary | ICD-10-CM | POA: Diagnosis not present

## 2015-09-26 DIAGNOSIS — Z0000 Encounter for general adult medical examination without abnormal findings: Secondary | ICD-10-CM | POA: Diagnosis not present

## 2015-09-26 DIAGNOSIS — M609 Myositis, unspecified: Secondary | ICD-10-CM

## 2015-09-26 DIAGNOSIS — M791 Myalgia: Secondary | ICD-10-CM | POA: Diagnosis not present

## 2015-09-26 MED ORDER — DULOXETINE HCL 20 MG PO CPEP: ORAL_CAPSULE | ORAL | Status: DC

## 2015-09-26 NOTE — Progress Notes (Signed)
Subjective:    Chief Complaint  Patient presents with  . Annual Exam   CC: cpe and chronic pain/ RA/ emotional distress  HPI: Mary Shaffer is a 50 y.o. female who presents to [place] at [facility] today a yearly health maintenance exam.  no abn pap, no abn mammo,  No Sunscreen regualry, denitst regularly,  Eye exam- none for many yrs   All recent labs reviewed with patient in office today   Chronic pain syndrome/ RA/ emotional distress: is upsetting to her, bothers her, shorter fuse.  15 yrs or longer.  Nothing has helped that Dr. Lenna Gilford the rheumatologist has tried. She has been on methotrexate, folic acid, meloxicam all made her feel worse so she just takes the NSAIDs over-the-counter when necessary  Obesity:  She did not get her goals that we set at last office visit.   she continues to drink her doctor pepper sodas.  She is upset because she feels her RA affects her ability to walk and exercise, and she really pays for the next day which is unmotivating her to move.  TOB ABUSE: She continues to smoke but is thought about quitting more recently.  She thinks with her current stress levels, she will not be able to entertain that at this time. She would like to get her RA and emotions under better control prior to engaging in smoking cessation  Health Maintenance Summary Reviewed and updated, unless pt declines services. Alcohol:        No concerns, no excessive use Exercise Habits:     rare STD concerns:     none Drug Use:     None Birth control method:    Not needed Menses regular:      HYST Lumps or breast concerns:      no Breast Cancer Family History:      no   History  Smoking status  . Current Every Day Smoker -- 1.00 packs/day  . Types: Cigarettes  Smokeless tobacco  . Never Used      Past Medical History  Diagnosis Date  . Arthritis     rheumatoid      Past Surgical History  Procedure Laterality Date  . Abdominal hysterectomy   08/20/2006      Family History  Problem Relation Age of Onset  . Alcohol abuse Brother   . Hypertension Son   . Hypertension Maternal Grandmother   . Diabetes Maternal Grandfather   . Rheum arthritis Paternal Grandmother       History  Drug Use No  ,   History  Alcohol Use No  ,   History  Smoking status  . Current Every Day Smoker -- 1.00 packs/day  . Types: Cigarettes  Smokeless tobacco  . Never Used  ,   History  Sexual Activity  . Sexual Activity: Yes    Comment: hysterectomy    Current Outpatient Prescriptions on File Prior to Visit  Medication Sig Dispense Refill  . ibuprofen (ADVIL,MOTRIN) 200 MG tablet Take 400 mg by mouth every 6 (six) hours as needed.     No current facility-administered medications on file prior to visit.     Codeine sulfate and Sulfa antibiotics    Review of Systems:  ( Completed via her adult medical history intake form today ) General:  Denies fever, chills, appetite changes, unexplained weight loss.  Respiratory: Denies SOB, DOE, cough, wheezing.  Cardiovascular: Denies chest pain, palpitations.  Gastrointestinal: Denies nausea, vomiting,  diarrhea, abdominal pain.  Genitourinary: Denies dysuria, increased frequency, flank pain. Endocrine: Denies hot or cold intolerance, polyuria, polydipsia. Musculoskeletal: Denies myalgias, back pain, joint swelling, arthralgias, gait problems.  Skin: Denies pallor, rash, suspicious lesions.  Neurological: Denies dizziness, seizures, syncope, unexplained weakness, lightheadedness, numbness and headaches.  Psychiatric/Behavioral: Denies mood changes, suicidal or homicidal ideations, hallucinations, sleep disturbances.   Objective:     Filed Vitals:   09/26/15 0959  Height: 5' 5.25" (1.657 m)  Weight: 206 lb 4.8 oz (93.577 kg)   Blood pressure 114/78, pulse 69, height 5' 5.25" (1.657 m), weight 206 lb 4.8 oz (93.577 kg). Body mass index is 34.08 kg/(m^2). General Appearance:     Alert, cooperative, no distress, appears stated age  Head:    Normocephalic, without obvious abnormality, atraumatic  Eyes:    PERRL, conjunctiva/corneas clear, EOM's intact, fundi    benign, both eyes  Ears:    Normal TM's and external ear canals, both ears  Nose:   Nares normal, septum midline, mucosa normal, no drainage    or sinus tenderness  Throat:   Lips w/o lesion, mucosa moist, and tongue normal; teeth and   gums normal  Neck:   Supple, symmetrical, trachea midline, no adenopathy;    thyroid:  no enlargement/tenderness/nodules; no carotid   bruit or JVD  Back:     Symmetric, no curvature, ROM normal, no CVA tenderness  Lungs:     Clear to auscultation bilaterally, respirations unlabored, no       Wh/ R/ R  Chest Wall:    No tenderness or gross deformity; normal excursion   Heart:    Regular rate and rhythm, S1 and S2 normal, no murmur, rub   or gallop  Breast Exam:    No tenderness, masses, or nipple abnormality b/l; no d/c  Abdomen:     Soft, non-tender, bowel sounds active all four quadrants, NO   G/R/R, no masses, no organomegaly  Genitalia:    deferred       Rectal:    deferred  Extremities:   Extremities normal, atraumatic, no cyanosis or gross edema  Pulses:   2+ and symmetric all extremities  Skin:   Warm, dry, Skin color, texture, turgor normal, no obvious rashes or lesions  Neurologic:   CNII-XII intact, normal strength, sensation and reflexes    Throughout Psychiatric: no SI/HI, depressed mood, Overwhelmed     Recent Results (from the past 2160 hour(s))  CBC with Differential/Platelet     Status: None   Collection Time: 09/05/15  8:15 AM  Result Value Ref Range   WBC 5.4 3.8 - 10.8 K/uL   RBC 4.82 3.80 - 5.10 MIL/uL   Hemoglobin 14.2 11.7 - 15.5 g/dL   HCT 43.2 35.0 - 45.0 %   MCV 89.6 80.0 - 100.0 fL   MCH 29.5 27.0 - 33.0 pg   MCHC 32.9 32.0 - 36.0 g/dL   RDW 13.7 11.0 - 15.0 %   Platelets 242 140 - 400 K/uL   MPV 10.2 7.5 - 12.5 fL   Neutro Abs 3294  1500 - 7800 cells/uL   Lymphs Abs 1458 850 - 3900 cells/uL   Monocytes Absolute 486 200 - 950 cells/uL   Eosinophils Absolute 108 15 - 500 cells/uL   Basophils Absolute 54 0 - 200 cells/uL   Neutrophils Relative % 61 %   Lymphocytes Relative 27 %   Monocytes Relative 9 %   Eosinophils Relative 2 %   Basophils Relative 1 %  Smear Review Criteria for review not met     Comment: ** Please note change in unit of measure and reference range(s). **  Comprehensive metabolic panel     Status: None   Collection Time: 09/05/15  8:15 AM  Result Value Ref Range   Sodium 140 135 - 146 mmol/L   Potassium 5.1 3.5 - 5.3 mmol/L   Chloride 106 98 - 110 mmol/L   CO2 27 20 - 31 mmol/L   Glucose, Bld 97 65 - 99 mg/dL   BUN 15 7 - 25 mg/dL   Creat 0.83 0.50 - 1.05 mg/dL    Comment:   For patients > or = 51 years of age: The upper reference limit for Creatinine is approximately 13% higher for people identified as African-American.      Total Bilirubin 0.4 0.2 - 1.2 mg/dL   Alkaline Phosphatase 62 33 - 130 U/L   AST 16 10 - 35 U/L   ALT 11 6 - 29 U/L   Total Protein 6.2 6.1 - 8.1 g/dL   Albumin 3.9 3.6 - 5.1 g/dL   Calcium 9.1 8.6 - 10.4 mg/dL  TSH     Status: None   Collection Time: 09/05/15  8:15 AM  Result Value Ref Range   TSH 1.41 mIU/L    Comment:   Reference Range   > or = 20 Years  0.40-4.50   Pregnancy Range First trimester  0.26-2.66 Second trimester 0.55-2.73 Third trimester  0.43-2.91     Hemoglobin A1c     Status: None   Collection Time: 09/05/15  8:15 AM  Result Value Ref Range   Hgb A1c MFr Bld 5.6 <5.7 %    Comment:   For the purpose of screening for the presence of diabetes:   <5.7%       Consistent with the absence of diabetes 5.7-6.4 %   Consistent with increased risk for diabetes (prediabetes) >=6.5 %     Consistent with diabetes   This assay result is consistent with a decreased risk of diabetes.   Currently, no consensus exists regarding use of hemoglobin  A1c for diagnosis of diabetes in children.   According to American Diabetes Association (ADA) guidelines, hemoglobin A1c <7.0% represents optimal control in non-pregnant diabetic patients. Different metrics may apply to specific patient populations. Standards of Medical Care in Diabetes (ADA).      Mean Plasma Glucose 114 mg/dL  VITAMIN D 25 Hydroxy (Vit-D Deficiency, Fractures)     Status: Abnormal   Collection Time: 09/05/15  8:15 AM  Result Value Ref Range   Vit D, 25-Hydroxy 24 (L) 30 - 100 ng/mL    Comment: Vitamin D Status           25-OH Vitamin D        Deficiency                <20 ng/mL        Insufficiency         20 - 29 ng/mL        Optimal             > or = 30 ng/mL   For 25-OH Vitamin D testing on patients on D2-supplementation and patients for whom quantitation of D2 and D3 fractions is required, the QuestAssureD 25-OH VIT D, (D2,D3), LC/MS/MS is recommended: order code [id number] (patients > 2 yrs).   Lipid panel     Status: None   Collection Time: 09/05/15  8:15 AM  Result Value Ref Range   Cholesterol 160 125 - 200 mg/dL   Triglycerides 110 <150 mg/dL   HDL 51 >=46 mg/dL   Total CHOL/HDL Ratio 3.1 <=5.0 Ratio   VLDL 22 <30 mg/dL   LDL Cholesterol 87 <130 mg/dL    Comment:   Total Cholesterol/HDL Ratio:CHD Risk                        Coronary Heart Disease Risk Table                                        Men       Women          1/2 Average Risk              3.4        3.3              Average Risk              5.0        4.4           2X Average Risk              9.6        7.1           3X Average Risk             23.4       11.0 Use the calculated Patient Ratio above and the CHD Risk table  to determine the patient's CHD Risk.      Impression and Recommendations:    All labs reviewed with patient today.  Chronic Myalgias and myositis Separate from patient's complete physical exam we addressed how emotionally and physically exhausting it is for her  to deal with her chronic pain syndrome from RA.   It is affecting her interpersonal relationships in a very negative way, and hindering her ability live her life.    Cymbalta discussed with patient. She understands risks and consents to treatment  Tobacco abuse Not interested in quitting at this time  Obesity (BMI 30-39.9) Encouraged patient just to move a little more and eat a little healthier.   asked her to pick one to 2 goals that she can focus on that are very small and achievable  Rheumatoid arthritis ([place]) -- In future we will consider Neurontin and/or Elavil at night in addition to the Cymbalta we put her on today.     Vitamin D deficiency Supplementation needed. Counseling done    1)  She has a lot of concerns about her chronic pain syndrome and rheumatoid arthritis and we had conversations about these today.    PT's GOALS:  - Do one lap around the track every other day for 2 weeks and then possibly increased to 2 laps for 2 weeks and so on - Golden milk with Tumeric discussed with patient- recipe given -- In future we will consider Neurontin and or Elavil at night and or Cymbalta  2) Information regarding Corgard given to patient. She declines referral for colonoscopy at all costs.  Go to eye doctor for routine exam.  [email or web address]   Yield Makes 2 cups Active Time 5 minutes Total Time 20 minutes    Meds ordered this encounter  Medications  . Cholecalciferol (VITAMIN D3) 5000 units TABS    Sig: Take 1  tablet by mouth daily.  . DULoxetine (CYMBALTA) 20 MG capsule    Sig: Start off taking 1 tablet before bedtime for 1 week and then increase to twice daily every 12 hours.    Dispense:  60 capsule    Refill:  0    1) Anticipatory Guidance: Discussed importance of wearing a seatbelt while driving, not texting while driving; changing batteries in smoke detector at least once annually;  sunscreen when outside along with skin surveillance; eating a balanced and modest diet; physical activity at least 25 minutes per day or 150 min/ week moderate to intense activity.  2) Immunizations / Screenings / Labs:  All immunizations are up-to-date per recommendations or will be updated today. Patient is due for dental and vision screens which pt will schedule independently. Obtain CBC, CMP, HgA1c, Lipid panel, TSH and vit D when fasting.   3) Weight:  BMI does indicate obese.   Discussed goal of losing 5-10% of current body weight which would improve overall feelings of well being and improve objective health data. Improve nutrient density of diet through increasing intake of fruits and vegetables and decreasing saturated fats, white flour products and refined sugars.   Gross side effects, risk and benefits, and alternatives of medications discussed with patient.  Patient is aware that all medications have potential side effects and we are unable to predict every sideeffect or drug-drug interaction that may occur.  Expresses verbal understanding and consents to current therapy plan and treatment regiment.  Follow-up preventative CPE in 1 year. Follow-up office visit pending lab work.  F/up sooner for chronic care management and/or prn  Please see orders placed and AVS handed out to patient at the end of our visit for further patient instructions/ counseling done pertaining to today's office visit.

## 2015-09-26 NOTE — Patient Instructions (Addendum)
Information regarding Corgard given to patient. She declines referral for colonoscopy at all costs.  Go to eye doctor for routine exam.  [email or web address]   Yield Makes 2 cups Active Time 5 minutes Total Time 20 minutes  Ingredients 1. 1 cup unsweetened non-dairy milk, preferably coconut milk beverage or almond milk  1 (3-inch) cinnamon stick  1 (1-inch) piece turmeric, unpeeled, thinly sliced, or 1/2 teaspoon dried turmeric  1 (1/2-inch) piece ginger, unpeeled, thinly sliced  1 tablespoon honey  1 tablespoon virgin coconut oil  1/4 teaspoon whole black peppercorns  Ground cinnamon (for serving)  Preparation 1. Whisk coconut milk, cinnamon, turmeric, ginger, honey, coconut oil, peppercorns, and 1 cup water in a small saucepan; bring to a low boil. Reduce heat and simmer until flavors have melded, about 10 minutes. Strain through a fine-mesh sieve into mugs and top with a dash of cinnamon.  2. Do Ahead  1. Renette Butters milk can be made 5 days ahead. Store in an airtight container and chill. Warm before serving.  Cooks' Note  Using fresh turmeric adds a clean, bright flavor to this drink, but dried turmeric can be substituted when fresh is not available. Keep in mind that dried turmeric will settle to the bottom of the mug, so stir well before drinking.  __________________________________________________________   *Do one lap around the track every other day for 2 weeks and then possibly increased to 2 laps for 2 weeks and so on   -- In future we will consider Neurontin and or Elavil at night and or Cymbalta        ___________________________________________________________________________________________________________________   Duloxetine delayed-release capsules What is this medicine? DULOXETINE (doo LOX e teen) is used to treat depression, anxiety, and different types of chronic pain. This medicine  may be used for other purposes; ask your health care provider or pharmacist if you have questions. What should I tell my health care provider before I take this medicine? They need to know if you have any of these conditions: -bipolar disorder or a family history of bipolar disorder -glaucoma -kidney disease -liver disease -suicidal thoughts or a previous suicide attempt -taken medicines called MAOIs like Carbex, Eldepryl, Marplan, Nardil, and Parnate within 14 days -an unusual reaction to duloxetine, other medicines, foods, dyes, or preservatives -pregnant or trying to get pregnant -breast-feeding How should I use this medicine? Take this medicine by mouth with a glass of water. Follow the directions on the prescription label. Do not cut, crush or chew this medicine. You can take this medicine with or without food. Take your medicine at regular intervals. Do not take your medicine more often than directed. Do not stop taking this medicine suddenly except upon the advice of your doctor. Stopping this medicine too quickly may cause serious side effects or your condition may worsen. A special MedGuide will be given to you by the pharmacist with each prescription and refill. Be sure to read this information carefully each time. Talk to your pediatrician regarding the use of this medicine in children. While this drug may be prescribed for children as young as 24 years of age for selected conditions, precautions do apply. Overdosage: If you think you have taken too much of this medicine contact a poison control center or emergency room at once. NOTE: This medicine is only for you. Do not share this medicine with others. What if I miss a dose? If you miss a dose, take it as soon as you can. If it is almost time for your  next dose, take only that dose. Do not take double or extra doses. What may interact with this medicine? Do not take this medicine with any of the following medications: -certain diet  drugs like dexfenfluramine, fenfluramine -desvenlafaxine -linezolid -MAOIs like Azilect, Carbex, Eldepryl, Marplan, Nardil, and Parnate -methylene blue (intravenous) -milnacipran -thioridazine -venlafaxine This medicine may also interact with the following medications: -alcohol -aspirin and aspirin-like medicines -certain antibiotics like ciprofloxacin and enoxacin -certain medicines for blood pressure, heart disease, irregular heart beat -certain medicines for depression, anxiety, or psychotic disturbances -certain medicines for migraine headache like almotriptan, eletriptan, frovatriptan, naratriptan, rizatriptan, sumatriptan, zolmitriptan -certain medicines that treat or prevent blood clots like warfarin, enoxaparin, and dalteparin -cimetidine -fentanyl -lithium -NSAIDS, medicines for pain and inflammation, like ibuprofen or naproxen -phentermine -procarbazine -sibutramine -St. John's wort -theophylline -tramadol -tryptophan This list may not describe all possible interactions. Give your health care provider a list of all the medicines, herbs, non-prescription drugs, or dietary supplements you use. Also tell them if you smoke, drink alcohol, or use illegal drugs. Some items may interact with your medicine. What should I watch for while using this medicine? Tell your doctor if your symptoms do not get better or if they get worse. Visit your doctor or health care professional for regular checks on your progress. Because it may take several weeks to see the full effects of this medicine, it is important to continue your treatment as prescribed by your doctor. Patients and their families should watch out for new or worsening thoughts of suicide or depression. Also watch out for sudden changes in feelings such as feeling anxious, agitated, panicky, irritable, hostile, aggressive, impulsive, severely restless, overly excited and hyperactive, or not being able to sleep. If this happens,  especially at the beginning of treatment or after a change in dose, call your health care professional. Bonita Quin may get drowsy or dizzy. Do not drive, use machinery, or do anything that needs mental alertness until you know how this medicine affects you. Do not stand or sit up quickly, especially if you are an older patient. This reduces the risk of dizzy or fainting spells. Alcohol may interfere with the effect of this medicine. Avoid alcoholic drinks. This medicine can cause an increase in blood pressure. This medicine can also cause a sudden drop in your blood pressure, which may make you feel faint and increase the chance of a fall. These effects are most common when you first start the medicine or when the dose is increased, or during use of other medicines that can cause a sudden drop in blood pressure. Check with your doctor for instructions on monitoring your blood pressure while taking this medicine. Your mouth may get dry. Chewing sugarless gum or sucking hard candy, and drinking plenty of water may help. Contact your doctor if the problem does not go away or is severe. What side effects may I notice from receiving this medicine? Side effects that you should report to your doctor or health care professional as soon as possible: -allergic reactions like skin rash, itching or hives, swelling of the face, lips, or tongue -changes in blood pressure -confusion -dark urine -dizziness -fast talking and excited feelings or actions that are out of control -fast, irregular heartbeat -fever -general ill feeling or flu-like symptoms -hallucination, loss of contact with reality -light-colored stools -loss of balance or coordination -redness, blistering, peeling or loosening of the skin, including inside the mouth -right upper belly pain -seizures -suicidal thoughts or other mood changes -trouble concentrating -  trouble passing urine or change in the amount of urine -unusual bleeding or  bruising -unusually weak or tired -yellowing of the eyes or skin Side effects that usually do not require medical attention (report to your doctor or health care professional if they continue or are bothersome): -blurred vision -change in appetite -change in sex drive or performance -headache -increased sweating -nausea This list may not describe all possible side effects. Call your doctor for medical advice about side effects. You may report side effects to FDA at 1-800-FDA-1088. Where should I keep my medicine? Keep out of the reach of children. Store at room temperature between 20 and 25 degrees C (68 to 77 degrees F). Throw away any unused medicine after the expiration date. NOTE: This sheet is a summary. It may not cover all possible information. If you have questions about this medicine, talk to your doctor, pharmacist, or health care provider.    2016, Elsevier/Gold Standard. (2013-02-23 29:93:71)

## 2015-10-01 DIAGNOSIS — Z720 Tobacco use: Secondary | ICD-10-CM | POA: Insufficient documentation

## 2015-10-01 DIAGNOSIS — M1990 Unspecified osteoarthritis, unspecified site: Secondary | ICD-10-CM | POA: Insufficient documentation

## 2015-10-01 DIAGNOSIS — E559 Vitamin D deficiency, unspecified: Secondary | ICD-10-CM | POA: Insufficient documentation

## 2015-10-01 DIAGNOSIS — F4323 Adjustment disorder with mixed anxiety and depressed mood: Secondary | ICD-10-CM | POA: Insufficient documentation

## 2015-10-01 DIAGNOSIS — Z90710 Acquired absence of both cervix and uterus: Secondary | ICD-10-CM | POA: Insufficient documentation

## 2015-10-01 NOTE — Assessment & Plan Note (Signed)
Separate from patient's complete physical exam we addressed how emotionally and physically exhausting it is for her to deal with her chronic pain syndrome from RA.   It is affecting her interpersonal relationships in a very negative way, and hindering her ability live her life.    Cymbalta discussed with patient. She understands risks and consents to treatment

## 2015-10-01 NOTE — Assessment & Plan Note (Signed)
--   In future we will consider Neurontin and/or Elavil at night in addition to the Cymbalta we put her on today.

## 2015-10-01 NOTE — Assessment & Plan Note (Signed)
Encouraged patient just to move a little more and eat a little healthier.   asked her to pick one to 2 goals that she can focus on that are very small and achievable

## 2015-10-01 NOTE — Assessment & Plan Note (Signed)
Not interested in quitting at this time

## 2015-10-01 NOTE — Assessment & Plan Note (Addendum)
Supplementation needed. Counseling done

## 2015-10-17 ENCOUNTER — Ambulatory Visit: Payer: Commercial Managed Care - HMO | Admitting: Family Medicine

## 2016-01-22 ENCOUNTER — Ambulatory Visit (INDEPENDENT_AMBULATORY_CARE_PROVIDER_SITE_OTHER): Payer: Commercial Managed Care - HMO

## 2016-01-22 DIAGNOSIS — E669 Obesity, unspecified: Secondary | ICD-10-CM | POA: Diagnosis not present

## 2016-01-22 DIAGNOSIS — Z713 Dietary counseling and surveillance: Secondary | ICD-10-CM | POA: Diagnosis not present

## 2016-01-22 DIAGNOSIS — Z23 Encounter for immunization: Secondary | ICD-10-CM

## 2016-03-14 ENCOUNTER — Ambulatory Visit (INDEPENDENT_AMBULATORY_CARE_PROVIDER_SITE_OTHER): Payer: Commercial Managed Care - HMO | Admitting: Family Medicine

## 2016-03-14 ENCOUNTER — Encounter: Payer: Self-pay | Admitting: Family Medicine

## 2016-03-14 VITALS — BP 102/71 | HR 75 | Ht 65.25 in | Wt 208.8 lb

## 2016-03-14 DIAGNOSIS — R7303 Prediabetes: Secondary | ICD-10-CM | POA: Diagnosis not present

## 2016-03-14 DIAGNOSIS — G894 Chronic pain syndrome: Secondary | ICD-10-CM | POA: Diagnosis not present

## 2016-03-14 DIAGNOSIS — E559 Vitamin D deficiency, unspecified: Secondary | ICD-10-CM | POA: Diagnosis not present

## 2016-03-14 DIAGNOSIS — E669 Obesity, unspecified: Secondary | ICD-10-CM | POA: Diagnosis not present

## 2016-03-14 DIAGNOSIS — R208 Other disturbances of skin sensation: Secondary | ICD-10-CM

## 2016-03-14 DIAGNOSIS — M791 Myalgia: Secondary | ICD-10-CM

## 2016-03-14 DIAGNOSIS — M7918 Myalgia, other site: Secondary | ICD-10-CM

## 2016-03-14 DIAGNOSIS — Z720 Tobacco use: Secondary | ICD-10-CM

## 2016-03-14 LAB — POCT GLYCOSYLATED HEMOGLOBIN (HGB A1C): HEMOGLOBIN A1C: 5.7

## 2016-03-14 MED ORDER — GABAPENTIN 100 MG PO CAPS: 100.0000 mg | ORAL_CAPSULE | Freq: Three times a day (TID) | ORAL | 0 refills | Status: DC

## 2016-03-14 NOTE — Patient Instructions (Addendum)
Start off with taking one Neurontin cap every evening for 1 week, if well tolerated then start with 2 caps daily for 1 week, and if still well tolerated, take it 3 times daily..   Then take 2 at night and then 1 in the afternoon and one in the morning for a week and if well tolerated take 1 in the morning 2 in the afternoon and 2 PM for week and then worked her way up to 2 caps 3 times a day.  Then we will go up on the dose and give you a new prescription.   Lose it app=- track everything    Guidelines for Losing Weight   We want weight loss that will last so you should lose 1-2 pounds a week.  THAT IS IT! Please pick THREE things a month to change. Once it is a habit check off the item. Then pick another three items off the list to become habits.  If you are already doing a habit on the list GREAT!  Cross that item off!  Don't drink your calories. Ie, alcohol, soda, fruit juice, and sweet tea.   Drink more water. Drink a glass when you feel hungry or before each meal.   Eat breakfast - Complex carb and protein (likeDannon light and fit yogurt, oatmeal, fruit, eggs, [place] bacon).  Measure your cereal.  Eat no more than one cup a day. (ie Kashi)  Eat an apple a day.  Add a vegetable a day.  Try a new vegetable a month.  Use Pam! Stop using oil or butter to cook.  Don't finish your plate or use smaller plates.  Share your dessert.  Eat sugar free Jello for dessert or frozen grapes.  Don't eat 2-3 hours before bed.  Switch to whole wheat bread, pasta, and brown rice.  Make healthier choices when you eat out. No fries!  Pick baked chicken, NOT fried.  Don't forget to SLOW DOWN when you eat. It is not going anywhere.   Take the stairs.  Park far away in the parking lot  Lift soup cans (or weights) for 10 minutes while watching TV.  Walk at work for 10 minutes during break.  Walk outside 1 time a week with your friend, kids, dog, or significant other.  Start a walking  group at church.  Walk the mall as much as you can tolerate.   Keep a food diary.  Weigh yourself daily.  Walk for 15 minutes 3 days per week.  Cook at home more often and eat out less. If life happens and you go back to old habits, it is okay.  Just start over. You can do it!  If you experience chest pain, get short of breath, or tired during the exercise, please stop immediately and inform your doctor.    Before you even begin to attack a weight-loss plan, it pays to remember this: You are not fat. You have fat. Losing weight isn't about blame or shame; it's simply another achievement to accomplish. Dieting is like any other skill-you have to buckle down and work at it. As long as you act in a smart, reasonable way, you'll ultimately get where you want to be. Here are some weight loss pearls for you.   1. It's Not a Diet. It's a Lifestyle Thinking of a diet as something you're on and suffering through only for the short term doesn't work. To shed weight and keep it off, you need to make permanent changes  to the way you eat. It's OK to indulge occasionally, of course, but if you cut calories temporarily and then revert to your old way of eating, you'll gain back the weight quicker than you can say yo-yo. Use it to lose it. Research shows that one of the best predictors of long-term weight loss is how many pounds you drop in the first month. For that reason, nutritionists often suggest being stricter for the first two weeks of your new eating strategy to build momentum. Cut out added sugar and alcohol and avoid unrefined carbs. After that, figure out how you can reincorporate them in a way that's healthy and maintainable.  2. There's a Right Way to Exercise Working out burns calories and fat and boosts your metabolism by building muscle. But those trying to lose weight are notorious for overestimating the number of calories they burn and underestimating the amount they take in. Unfortunately,  your system is biologically programmed to hold on to extra pounds and that means when you start exercising, your body senses the deficit and ramps up its hunger signals. If you're not diligent, you'll eat everything you burn and then some. Use it, to lose it. Cardio gets all the exercise glory, but strength and interval training are the real heroes. They help you build lean muscle, which in turn increases your metabolism and calorie-burning ability 3. Don't Overreact to Mild Hunger Some people have a hard time losing weight because of hunger anxiety. To them, being hungry is bad-something to be avoided at all costs-so they carry snacks with them and eat when they don't need to. Others eat because they're stressed out or bored. While you never want to get to the point of being ravenous (that's when bingeing is likely to happen), a hunger pang, a craving, or the fact that it's 3:00 p.m. should not send you racing for the vending machine or obsessing about the energy bar in your purse. Ideally, you should put off eating until your stomach is growling and it's difficult to concentrate.  Use it to lose it. When you feel the urge to eat, use the HALT method. Ask yourself, Am I really hungry? Or am I angry or anxious, lonely or bored, or tired? If you're still not certain, try the apple test. If you're truly hungry, an apple should seem delicious; if it doesn't, something else is going on. Or you can try drinking water and making yourself busy, if you are still hungry try a healthy snack.  4. Not All Calories Are Created Equal The mechanics of weight loss are pretty simple: Take in fewer calories than you use for energy. But the kind of food you eat makes all the difference. Processed food that's high in saturated fat and refined starch or sugar can cause inflammation that disrupts the hormone signals that tell your brain you're full. The result: You eat a lot more.  Use it to lose it. Clean up your diet. Swap in  whole, unprocessed foods, including vegetables, lean protein, and healthy fats that will fill you up and give you the biggest nutritional bang for your calorie buck. In a few weeks, as your brain starts receiving regular hunger and fullness signals once again, you'll notice that you feel less hungry overall and naturally start cutting back on the amount you eat.  5. Protein, Produce, and Plant-Based Fats Are Your Weight-Loss Trinity Here's why eating the three Ps regularly will help you drop pounds. Protein fills you up. You need it  to build lean muscle, which keeps your metabolism humming so that you can torch more fat. People in a weight-loss program who ate double the recommended daily allowance for protein (about 110 grams for a 150-pound woman) lost 70 percent of their weight from fat, while people who ate the RDA lost only about 40 percent, one study found. Produce is packed with filling fiber. "It's very difficult to consume too many calories if you're eating a lot of vegetables. Example: Three cups of broccoli is a lot of food, yet only 93 calories. (Fruit is another story. It can be easy to overeat and can contain a lot of calories from sugar, so be sure to monitor your intake.) Plant-based fats like olive oil and those in avocados and nuts are healthy and extra satiating.  Use it to lose it. Aim to incorporate each of the three Ps into every meal and snack. People who eat protein throughout the day are able to keep weight off, according to a study in the American Journal of Clinical Nutrition. In addition to meat, poultry and seafood, good sources are beans, lentils, eggs, tofu, and yogurt. As for fat, keep portion sizes in check by measuring out salad dressing, oil, and nut butters (shoot for one to two tablespoons). Finally, eat veggies or a little fruit at every meal. People who did that consumed 308 fewer calories but didn't feel any hungrier than when they didn't eat more produce.  7. How You  Eat Is As Important As What You Eat In order for your brain to register that you're full, you need to focus on what you're eating. Sit down whenever you eat, preferably at a table. Turn off the TV or computer, put down your phone, and look at your food. Smell it. Chew slowly, and don't put another bite on your fork until you swallow. When women ate lunch this attentively, they consumed 30 percent less when snacking later than those who listened to an audiobook at lunchtime, according to a study in the [place] Journal of Nutrition. 8. Weighing Yourself Really Works The scale provides the best evidence about whether your efforts are paying off. Seeing the numbers tick up or down or stagnate is motivation to keep going-or to rethink your approach. A 2015 study at [facility] found that daily weigh-ins helped people lose more weight, keep it off, and maintain that loss, even after two years. Use it to lose it. Step on the scale at the same time every day for the best results. If your weight shoots up several pounds from one weigh-in to the next, don't freak out. Eating a lot of salt the night before or having your period is the likely culprit. The number should return to normal in a day or two. It's a steady climb that you need to do something about. 9. Too Much Stress and Too Little Sleep Are Your Enemies When you're tired and frazzled, your body cranks up the production of cortisol, the stress hormone that can cause carb cravings. Not getting enough sleep also boosts your levels of ghrelin, a hormone associated with hunger, while suppressing leptin, a hormone that signals fullness and satiety. People on a diet who slept only five and a half hours a night for two weeks lost 55 percent less fat and were hungrier than those who slept eight and a half hours, according to a study in the [place] [employer] Journal. Use it to lose it. Prioritize sleep, aiming for seven hours or more  a night, which  research shows helps lower stress. And make sure you're getting quality zzz's. If a snoring spouse or a fidgety cat wakes you up frequently throughout the night, you may end up getting the equivalent of just four hours of sleep, according to a study from [facility]. Keep pets out of the bedroom, and use a white-noise app to drown out snoring. 10. You Will Hit a plateau-And You Can Bust Through It As you slim down, your body releases much less leptin, the fullness hormone.  If you're not strength training, start right now. Building muscle can raise your metabolism to help you overcome a plateau. To keep your body challenged and burning calories, incorporate new moves and more intense intervals into your workouts or add another sweat session to your weekly routine. Alternatively, cut an extra 100 calories or so a day from your diet. Now that you've lost weight, your body simply doesn't need as much fuel.      Since food equals calories, in order to lose weight you must either eat fewer calories, exercise more to burn off calories with activity, or both. Food that is not used to fuel the body is stored as fat. A major component of losing weight is to make smarter food choices. Here's how:  1)   Limit non-nutritious foods, such as: Sugar, honey, syrups and candy Pastries, donuts, pies, cakes and cookies Soft drinks, sweetened juices and alcoholic beverages  2)  Cut down on high-fat foods by: - Choosing poultry, fish or lean red meat - Choosing low-fat cooking methods, such as baking, broiling, steaming, grilling and boiling - Using low-fat or non-fat dairy products - Using vinaigrette, herbs, lemon or fat-free salad dressings - Avoiding fatty meats, such as bacon, sausage, franks, ribs and luncheon meats - Avoiding high-fat snacks like nuts, chips and chocolate - Avoiding fried foods - Using less butter, margarine, oil and mayonnaise - Avoiding high-fat gravies, cream sauces and  cream-based soups  3) Eat a variety of foods, including: - Fruit and vegetables that are raw, steamed or baked - Whole grains, breads, cereal, rice and pasta - Dairy products, such as low-fat or non-fat milk or yogurt, low-fat cottage cheese and low-fat cheese - Protein-rich foods like chicken, [place], fish, lean meat and legumes, or beans  4) Change your eating habits by: - Eat three balanced meals a day to help control your hunger - Watch portion sizes and eat small servings of a variety of foods - Choose low-calorie snacks - Eat only when you are hungry and stop when you are satisfied - Eat slowly and try not to perform other tasks while eating - Find other activities to distract you from food, such as walking, taking up a hobby or being involved in the community - Include regular exercise in your daily routine ( minimum of 20 min of moderate-intensity exercise at least 5 days/week)  - Find a support group, if necessary, for emotional support in your weight loss journey      Easy ways to cut 100 calories  1. Eat your eggs with hot sauce OR salsa instead of cheese.  Eggs are great for breakfast, but many people consider eggs and cheese to be BFFs. Instead of cheese-1 oz. of cheddar has 114 calories-top your eggs with hot sauce, which contains no calories and helps with satiety and metabolism. Salsa is also a great option!!  2. Top your toast, waffles or pancakes with fresh berries instead of jelly or syrup.  Half a cup of berries-fresh, frozen or thawed-has about 40 calories, compared with 2 tbsp. of maple syrup or jelly, which both have about 100 calories. The berries will also give you a good punch of fiber, which helps keep you full and satisfied and won't spike blood sugar quickly like the jelly or syrup. 3. Swap the non-fat latte for black coffee with a splash of half-and-half. Contrary to its name, that non-fat latte has 130 calories and a startling 19g of carbohydrates per 16 oz.  serving. Replacing that 'light' drinkable dessert with a black coffee with a splash of half-and-half saves you more than 100 calories per 16 oz. serving. 4. Sprinkle salads with freeze-dried raspberries instead of dried cranberries. If you want a sweet addition to your nutritious salad, stay away from dried cranberries. They have a whopping 130 calories per  cup and 30g carbohydrates. Instead, sprinkle freeze-dried raspberries guilt-free and save more than 100 calories per  cup serving, adding 3g of belly-filling fiber. 5. Go for mustard in place of mayo on your sandwich. Mustard can add really nice flavor to any sandwich, and there are tons of varieties, from spicy to honey. A serving of mayo is 95 calories, versus 10 calories in a serving of mustard.  Or try an avocado mayo spread: You can find the recipe few click this link: https://www.californiaavocado.com/recipes/recipe-container/[place]-avocado-mayo 6. Choose a DIY salad dressing instead of the store-bought kind. Mix Dijon or whole grain mustard with low-fat Kefir or red wine vinegar and garlic. 7. Use hummus as a spread instead of a dip. Use hummus as a spread on a high-fiber cracker or tortilla with a sandwich and save on calories without sacrificing taste. 8. Pick just one salad "accessory." Salad isn't automatically a calorie winner. It's easy to over-accessorize with toppings. Instead of topping your salad with nuts, avocado and cranberries (all three will clock in at 313 calories), just pick one. The next day, choose a different accessory, which will also keep your salad interesting. You don't wear all your jewelry every day, right? 9. Ditch the white pasta in favor of spaghetti squash. One cup of cooked spaghetti squash has about 40 calories, compared with traditional spaghetti, which comes with more than 200. Spaghetti squash is also nutrient-dense. It's a good source of fiber and Vitamins A and C, and it can be eaten just like you  would eat pasta-with a great tomato sauce and [place] meatballs or with pesto, tofu and spinach, for example. 10. Dress up your chili, soups and stews with non-fat [place] yogurt instead of sour cream. Just a 'dollop' of sour cream can set you back 115 calories and a whopping 12g of fat-seven of which are of the artery-clogging variety. Added bonus: [place] yogurt is packed with muscle-building protein, calcium and B Vitamins. 11. Mash cauliflower instead of mashed potatoes. One cup of traditional mashed potatoes-in all their creamy goodness-has more than 200 calories, compared to mashed cauliflower, which you can typically eat for less than 100 calories per 1 cup serving. Cauliflower is a great source of the antioxidant indole-3-carbinol (I3C), which may help reduce the risk of some cancers, like breast cancer. 12. Ditch the ice cream sundae in favor of a [place] yogurt parfait. Instead of a cup of ice cream or fro-yo for dessert, try 1 cup of nonfat Greek yogurt topped with fresh berries and a sprinkle of cacao nibs. Both toppings are packed with antioxidants, which can help reduce cellular inflammation and oxidative damage. And the comparison is a  no-brainer: One cup of ice cream has about 275 calories; one cup of frozen yogurt has about 230; and a cup of Greek yogurt has just 130, plus twice the protein, so you're less likely to return to the freezer for a second helping. 13. Put olive oil in a spray container instead of using it directly from the bottle. Each tablespoon of olive oil is 120 calories and 15g of fat. Use a mister instead of pouring it straight into the pan or onto a salad. This allows for portion control and will save you more than 100 calories. 14. When baking, substitute canned pumpkin for butter or oil. Canned pumpkin-not pumpkin pie mix-is loaded with Vitamin A, which is important for skin and eye health, as well as immunity. And the comparisons are pretty crazy:  cup of canned pumpkin has  about 40 calories, compared to butter or oil, which has more than 800 calories. Yes, 800 calories. Applesauce and mashed banana can also serve as good substitutions for butter or oil, usually in a 1:1 ratio. 15. Top casseroles with high-fiber cereal instead of breadcrumbs. Breadcrumbs are typically made with white bread, while breakfast cereals contain 5-9g of fiber per serving. Not only will you save more than 150 calories per  cup serving, the swap will also keep you more full and you'll get a metabolism boost from the added fiber. 16. Snack on pistachios instead of macadamia nuts. Believe it or not, you get the same amount of calories from 35 pistachios (100 calories) as you would from only five macadamia nuts. 17. Chow down on kale chips rather than potato chips. This is my favorite 'don't knock it 'till you try it' swap. Kale chips are so easy to make at home, and you can spice them up with a little grated parmesan or chili powder. Plus, they're a mere fraction of the calories of potato chips, but with the same crunch factor we crave so often. 18. Add seltzer and some fruit slices to your cocktail instead of soda or fruit juice. One cup of soda or fruit juice can pack on as much as 140 calories. Instead, use seltzer and fruit slices. The fruit provides valuable phytochemicals, such as flavonoids and anthocyanins, which help to combat cancer and stave off the aging process.      Phentermine  While taking the medication we may ask that you come into the office once a month or once every 2-3 months to monitor your weight, blood pressure, and heart rate. In addition we can help answer your questions about diet, exercise, and help you every step of the way with your weight loss journey. Sometime it is helpful if you bring in a food diary or use an app on your phone such as myfitnesspal to record your calorie intake, especially in the beginning.   You can start out on 1/3 to 1/2 a pill in the  morning and if you are tolerating it well you can increase to one pill daily. I also have some patients that take 1/3 or 1/2 at lunch to help prevent night time eating.  This medication is cheapest CASH pay at [facility] is 14-17 dollars and you do NOT need a membership to get meds from there.    What is this medicine? PHENTERMINE (FEN ter meen) decreases your appetite. This medicine is intended to be used in addition to a healthy reduced calorie diet and exercise. The best results are achieved this way. This medicine  is only indicated for short-term use. Eventually your weight loss may level out and the medication will no longer be needed.   How should I use this medicine? Take this medicine by mouth. Follow the directions on the prescription label. The tablets should stay in the bottle until immediately before you take your dose. Take your doses at regular intervals. Do not take your medicine more often than directed.  Overdosage: If you think you have taken too much of this medicine contact a poison control center or emergency room at once. NOTE: This medicine is only for you. Do not share this medicine with others.  What if I miss a dose? If you miss a dose, take it as soon as you can. If it is almost time for your next dose, take only that dose. Do not take double or extra doses. Do not increase or in any way change your dose without consulting your doctor.  What should I watch for while using this medicine? Notify your physician immediately if you become short of breath while doing your normal activities. Do not take this medicine within 6 hours of bedtime. It can keep you from getting to sleep. Avoid drinks that contain caffeine and try to stick to a regular bedtime every night. Do not stand or sit up quickly, especially if you are an older patient. This reduces the risk of dizzy or fainting spells. Avoid alcoholic drinks.  What side effects may I notice from  receiving this medicine? Side effects that you should report to your doctor or health care professional as soon as possible: -chest pain, palpitations -depression or severe changes in mood -increased blood pressure -irritability -nervousness or restlessness -severe dizziness -shortness of breath -problems urinating -unusual swelling of the legs -vomiting  Side effects that usually do not require medical attention (report to your doctor or health care professional if they continue or are bothersome): -blurred vision or other eye problems -changes in sexual ability or desire -constipation or diarrhea -difficulty sleeping -dry mouth or unpleasant taste -headache -nausea This list may not describe all possible side effects. Call your doctor for medical advice about side effects. You may report side effects to FDA at 1-800-FDA-1088.     Prediabetes Eating Plan Prediabetes--also called impaired glucose tolerance or impaired fasting glucose--is a condition that causes blood sugar (blood glucose) levels to be higher than normal. Following a healthy diet can help to keep prediabetes under control. It can also help to lower the risk of type 2 diabetes and heart disease, which are increased in people who have prediabetes. Along with regular exercise, a healthy diet:  Promotes weight loss.  Helps to control blood sugar levels.  Helps to improve the way that the body uses insulin. WHAT DO I NEED TO KNOW ABOUT THIS EATING PLAN?  Use the glycemic index (GI) to plan your meals. The index tells you how quickly a food will raise your blood sugar. Choose low-GI foods. These foods take a longer time to raise blood sugar.  Pay close attention to the amount of carbohydrates in the food that you eat. Carbohydrates increase blood sugar levels.  Keep track of how many calories you take in. Eating the right amount of calories will help you to achieve a healthy weight. Losing about 7 percent of your  starting weight can help to prevent type 2 diabetes.  You may want to follow a Mediterranean diet. This diet includes a lot of vegetables, lean meats or fish, whole grains, fruits, and  healthy oils and fats. WHAT FOODS CAN I EAT? Grains Whole grains, such as whole-wheat or whole-grain breads, crackers, cereals, and pasta. Unsweetened oatmeal. Bulgur. Barley. Quinoa. Brown rice. Corn or whole-wheat flour tortillas or taco shells. Vegetables Lettuce. Spinach. Peas. Beets. Cauliflower. Cabbage. Broccoli. Carrots. Tomatoes. Squash. Eggplant. Herbs. Peppers. Onions. Cucumbers. Brussels sprouts. Fruits Berries. Bananas. Apples. Oranges. Grapes. Papaya. Mango. Pomegranate. Kiwi. Grapefruit. Cherries. Meats and Other Protein Sources Seafood. Lean meats, such as chicken and [place] or lean cuts of pork and beef. Tofu. Eggs. Nuts. Beans. Dairy Low-fat or fat-free dairy products, such as yogurt, cottage cheese, and cheese. Beverages Water. Tea. Coffee. Sugar-free or diet soda. Seltzer water. Milk. Milk alternatives, such as soy or almond milk. Condiments Mustard. Relish. Low-fat, low-sugar ketchup. Low-fat, low-sugar barbecue sauce. Low-fat or fat-free mayonnaise. Sweets and Desserts Sugar-free or low-fat pudding. Sugar-free or low-fat ice cream and other frozen treats. Fats and Oils Avocado. Walnuts. Olive oil. The items listed above may not be a complete list of recommended foods or beverages. Contact your dietitian for more options.  WHAT FOODS ARE NOT RECOMMENDED? Grains Refined white flour and flour products, such as bread, pasta, snack foods, and cereals. Beverages Sweetened drinks, such as sweet iced tea and soda. Sweets and Desserts Baked goods, such as cake, cupcakes, pastries, cookies, and cheesecake. The items listed above may not be a complete list of foods and beverages to avoid. Contact your dietitian for more information.   This information is not intended to replace advice given  to you by your health care provider. Make sure you discuss any questions you have with your health care provider.   Document Released: 07/19/2014 Document Reviewed: 07/19/2014 Elsevier Interactive Patient Education 2016 [employer].     Risk factors for prediabetes and type 2 diabetes  Researchers don't fully understand why some people develop prediabetes and type 2 diabetes and others don't.  It's clear that certain factors increase the risk, however, including:  Weight. The more fatty tissue you have, the more resistant your cells become to insulin.  Inactivity. The less active you are, the greater your risk. Physical activity helps you control your weight, uses up glucose as energy and makes your cells more sensitive to insulin.  Family history. Your risk increases if a parent or sibling has type 2 diabetes.  Race. Although it's unclear why, people of certain races - including blacks, Hispanics, American Indians and Asian-Americans - are at higher risk.  Age. Your risk increases as you get older. This may be because you tend to exercise less, lose muscle mass and gain weight as you age. But type 2 diabetes is also increasing dramatically among children, adolescents and younger adults.  Gestational diabetes. If you developed gestational diabetes when you were pregnant, your risk of developing prediabetes and type 2 diabetes later increases. If you gave birth to a baby weighing more than 9 pounds (4 kilograms), you're also at risk of type 2 diabetes.  Polycystic ovary syndrome. For women, having polycystic ovary syndrome - a common condition characterized by irregular menstrual periods, excess hair growth and obesity - increases the risk of diabetes.  High blood pressure. Having blood pressure over 140/90 millimeters of mercury (mm Hg) is linked to an increased risk of type 2 diabetes.  Abnormal cholesterol and triglyceride levels. If you have low levels of high-density lipoprotein (HDL), or  "good," cholesterol, your risk of type 2 diabetes is higher. Triglycerides are another type of fat carried in the blood. People with high levels  of triglycerides have an increased risk of type 2 diabetes. Your doctor can let you know what your cholesterol and triglyceride levels are.    A good guide to good carbs: The glycemic index ---If you have diabetes, or at risk for diabetes, you know all too well that when you eat carbohydrates, your blood sugar goes up. The total amount of carbs you consume at a meal or in a snack mostly determines what your blood sugar will do. But the food itself also plays a role. A serving of white rice has almost the same effect as eating pure table sugar - a quick, high spike in blood sugar. A serving of lentils has a slower, smaller effect.  ---Picking good sources of carbs can help you control your blood sugar and your weight. Even if you don't have diabetes, eating healthier carbohydrate-rich foods can help ward off a host of chronic conditions, from heart disease to various cancers to, well, diabetes.  ---One way to choose foods is with the glycemic index (GI). This tool measures how much a food boosts blood sugar.  The glycemic index rates the effect of a specific amount of a food on blood sugar compared with the same amount of pure glucose. A food with a glycemic index of 28 boosts blood sugar only 28% as much as pure glucose. One with a GI of 95 acts like pure glucose.  High glycemic foods result in a quick spike in insulin and blood sugar (also known as blood glucose).  Low glycemic foods have a slower, smaller effect- these are healthier for you.   Using the glycemic index Using the glycemic index is easy: choose foods in the low GI category instead of those in the high GI category (see below), and go easy on those in between. Low glycemic index (GI of 55 or less): Most fruits and vegetables, beans, minimally processed grains, pasta, low-fat dairy foods, and  nuts.  Moderate glycemic index (GI 56 to 69): White and sweet potatoes, corn, white rice, couscous, breakfast cereals such as Cream of Wheat and Mini Wheats.  High glycemic index (GI of 70 or higher): White bread, rice cakes, most crackers, bagels, cakes, doughnuts, croissants, most packaged breakfast cereals. You can see the values for 100 commons foods and get links to more at www.health.[employer]Debt.hu.  Swaps for lowering glycemic index  Instead of this high-glycemic index food Eat this lower-glycemic index food  White rice Brown rice or converted rice  Instant oatmeal Steel-cut oats  Cornflakes Bran flakes  Baked potato Pasta, bulgur  White bread Whole-grain bread  Corn Peas or leafy greens

## 2016-03-14 NOTE — Progress Notes (Addendum)
Impression and Recommendations:    1. Prediabetes   2. Obesity (BMI 30-39.9)   3. Chronic pain syndrome   4. Muscle pain, myofacial   5. Dysesthesia affecting both sides of body   6. Vitamin D deficiency   7. Tobacco abuse     Next office visit patient will bring in her lose it app or something like that so we can review her dietary habits, and then we will possibly give her phentermine or contraceptive or Q's in the etc., some weight loss medicine to aid her if she is learning to eat right already. She understands she needs to eat over 50% of her diet is protein   Chronic pain syndrome For her severe myalgias and dysesthesia symptoms, trial of Neurontin.  - Slowly taper up over time.  Extensive discussion with patient about risk benefits and how to use medicines had.  - Follow up sooner then planned if problems or concerns  Obesity (BMI 30-39.9) Patient would like to entertain going on a weight loss medicine in the future.  - We discussed strategies and various medicines.  She will start by focusing on getting her diet right.  - Use lose it app or my fitness pal to track everything you put in your mouth.  - Goal: Lose 5% of your own wt per month; then we will consider weight loss meds to augment your wt loss      Prediabetes\ glucose intolerance A1c today 5.7.  Counseling done regarding disease process and various treatment options.  All questions answered  Handouts given if patient desired them  Encouraged weight loss and patient feels motivated to get healthy now.   Tobacco abuse Not ready to quit.  Wants to get her weight under control first and then she will consider quitting smoking     Orders Placed This Encounter  Procedures  . VITAMIN D 25 Hydroxy (Vit-D Deficiency, Fractures)  . POCT glycosylated hemoglobin (Hb A1C)     New Prescriptions   GABAPENTIN (NEURONTIN) 100 MG CAPSULE    Take 1 capsule (100 mg total) by mouth 3 (three) times  daily.   VITAMIN D, ERGOCALCIFEROL, (DRISDOL) 50000 UNITS CAPS CAPSULE    Take 1 capsule (50,000 Units total) by mouth every 7 (seven) days.    Modified Medications   No medications on file    Discontinued Medications   DULOXETINE (CYMBALTA) 20 MG CAPSULE    Start off taking 1 tablet before bedtime for 1 week and then increase to twice daily every 12 hours.    The patient was counseled, risk factors were discussed, anticipatory guidance given.  Gross side effects, risk and benefits, and alternatives of medications and treatment plan in general discussed with patient.  Patient is aware that all medications have potential side effects and we are unable to predict every side effect or drug-drug interaction that may occur.   Patient will call with any questions prior to using medication if they have concerns.  Expresses verbal understanding and consents to current therapy and treatment regimen.  No barriers to understanding were identified.  Red flag symptoms and signs discussed in detail.  Patient expressed understanding regarding what to do in case of emergency\urgent symptoms  Return for jan 11 th- to go over lose it app.  Please see AVS handed out to patient at the end of our visit for further patient instructions/ counseling done pertaining to today's office visit.    Note: This document was prepared using Dragon  voice recognition software and may include unintentional dictation errors.   --------------------------------------------------------------------------------------------------------------------------------------------------------------------------------------------------------------------------------------------    Subjective:    CC:  Chief Complaint  Patient presents with  . Follow-up    HPI: Mary Shaffer is a 50 y.o. female who presents to [facility] today for issues as discussed below.   Needs Vit D reck and A1c was 5.6-  6 mo  ago   Has had skin sensitivity and chronic pain ever since dx with RA 2005.  Never tried Neurontin in past- but skin sensitivities have gotten  M W past 3 yrs or so.   Failed Cymbalta, mobic, methotrexate and sev others,    Wt Readings from Last 3 Encounters:  03/29/16 199 lb 12.8 oz (90.6 kg)  03/14/16 208 lb 12.8 oz (94.7 kg)  09/26/15 206 lb 4.8 oz (93.6 kg)   BP Readings from Last 3 Encounters:  03/29/16 107/71  03/14/16 102/71  09/26/15 114/78   Pulse Readings from Last 3 Encounters:  03/29/16 71  03/14/16 75  09/26/15 69   BMI Readings from Last 3 Encounters:  03/29/16 32.99 kg/m  03/14/16 34.48 kg/m  09/26/15 34.07 kg/m     Patient Care Team    Relationship Specialty Notifications Start End  Thomasene Lot, DO PCP - General Family Medicine  08/17/15     Patient Active Problem List   Diagnosis Date Noted  . Prediabetes\ glucose intolerance 04/10/2016    Priority: High  . 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 Obesity (BMI 30-39.9) 08/17/2015    Priority: High  . Rheumatoid arthritis (HCC) 08/17/2015    Priority: High  . History of hysterectomy - benign reasons 10/01/2015    Priority: Medium  . Chronic pain syndrome 09/26/2015    Priority: Medium  . Muscle pain, myofacial 01/08/2007    Priority: Medium  . Vitamin D deficiency 10/01/2015    Priority: Low  . Continuous tobacco abuse 10/01/2015    Priority: Low  . Tobacco abuse 08/17/2015    Priority: Low  . Tobacco abuse counseling 08/17/2015    Priority: Low  . Dysesthesia affecting both sides of body 04/10/2016  . Encounter for weight loss counseling 03/30/2016  . Arthritis 10/01/2015  . CONTACT DERMATITIS&OTHER ECZEMA DUE UNSPEC CAUSE 03/24/2009    Past Medical history, Surgical history, Family history, Social history, Allergies and Medications have been entered into the medical record, reviewed and changed as needed.   Allergies:  Allergies   Allergen Reactions  . Codeine Sulfate     REACTION: Rash and nausea  . Sulfa Antibiotics Itching    And hives    Review of Systems  Constitutional: Negative for chills and fever.  HENT: Negative for congestion and sinus pain.   Eyes: Negative for blurred vision and double vision.  Respiratory: Negative for shortness of breath and wheezing.   Cardiovascular: Negative for chest pain, palpitations and orthopnea.  Gastrointestinal: Positive for heartburn. Negative for constipation, diarrhea, nausea and vomiting.  Genitourinary: Negative for frequency and hematuria.  Musculoskeletal: Positive for joint pain. Negative for falls.       Chronic jt pains  Skin: Negative for rash.  Neurological: Negative for dizziness, sensory change and focal weakness.  Endo/Heme/Allergies: Negative for polydipsia.  Psychiatric/Behavioral: Negative for depression and suicidal ideas. The patient is not nervous/anxious and does not have insomnia.      Objective:   Blood pressure 102/71, pulse 75, height 5' 5.25" (1.657 m),  weight 208 lb 12.8 oz (94.7 kg). Body mass index is 34.48 kg/m. General: Well Developed, well nourished, appropriate for stated age.  Neuro: Alert and oriented x3, extra-ocular muscles intact HEENT: Normocephalic, atraumatic, neck supple, no carotid bruits appreciated  Skin: no gross rash. Cardiac: RRR, S1 S2 Respiratory: ECTA B/L, Not using accessory muscles, speaking in full sentences-unlabored. Vascular:  Ext warm, dry, pink; cap RF less 2 sec. Psych: No HI/SI, judgement and insight good, Euthymic mood. Full Affect.

## 2016-03-15 LAB — VITAMIN D 25 HYDROXY (VIT D DEFICIENCY, FRACTURES): Vit D, 25-Hydroxy: 30 ng/mL (ref 30–100)

## 2016-03-22 ENCOUNTER — Other Ambulatory Visit: Payer: Self-pay

## 2016-03-22 MED ORDER — VITAMIN D (ERGOCALCIFEROL) 1.25 MG (50000 UNIT) PO CAPS: 50000.0000 [IU] | ORAL_CAPSULE | ORAL | 2 refills | Status: DC

## 2016-03-22 NOTE — Progress Notes (Signed)
cal

## 2016-03-29 ENCOUNTER — Ambulatory Visit (INDEPENDENT_AMBULATORY_CARE_PROVIDER_SITE_OTHER): Payer: Commercial Managed Care - HMO | Admitting: Family Medicine

## 2016-03-29 ENCOUNTER — Encounter: Payer: Self-pay | Admitting: Family Medicine

## 2016-03-29 VITALS — BP 107/71 | HR 71 | Ht 65.25 in | Wt 199.8 lb

## 2016-03-29 DIAGNOSIS — E669 Obesity, unspecified: Secondary | ICD-10-CM | POA: Diagnosis not present

## 2016-03-29 DIAGNOSIS — Z713 Dietary counseling and surveillance: Secondary | ICD-10-CM | POA: Diagnosis not present

## 2016-03-29 MED ORDER — PHENTERMINE HCL 37.5 MG PO TABS: 37.5000 mg | ORAL_TABLET | Freq: Every day | ORAL | 2 refills | Status: DC

## 2016-03-29 NOTE — Progress Notes (Signed)
Impression and Recommendations:    1. Obesity (BMI 30-39.9)   2. Encounter for weight loss counseling     Obesity (BMI 30-39.9) Weight loss counseling done risks and benefits of phentermine discussed with patient she understands those. Her goal is going to be 5% of her weight loss monthly. She will continue track her foods using her weight watchers And going to meetings.  Patient will follow up with my CMA in 4 weeks for weigh-in with Tonya.   New Prescriptions   PHENTERMINE (ADIPEX-P) 37.5 MG TABLET    Take 1 tablet (37.5 mg total) by mouth daily before breakfast.    Modified Medications   No medications on file    Discontinued Medications   No medications on file    The patient was counseled, risk factors were discussed, anticipatory guidance given.  Gross side effects, risk and benefits, and alternatives of medications and treatment plan in general discussed with patient.  Patient is aware that all medications have potential side effects and we are unable to predict every side effect or drug-drug interaction that may occur.   Patient will call with any questions prior to using medication if they have concerns.  Expresses verbal understanding and consents to current therapy and treatment regimen.  No barriers to understanding were identified.  Red flag symptoms and signs discussed in detail.  Patient expressed understanding regarding what to do in case of emergency\urgent symptoms  Return in about 4 weeks (around 04/26/2016) for wt loss- weight checkin in in 4 wks exactly with Tonya. .  Please see AVS handed out to patient at the end of our visit for further patient instructions/ counseling done pertaining to today's office visit.    Note: This document was prepared using Dragon voice recognition software and may include unintentional dictation  errors.   --------------------------------------------------------------------------------------------------------------------------------------------------------------------------------------------------------------------------------------------    Subjective:    CC:  Chief Complaint  Patient presents with  . Follow-up    HPI: Mary Shaffer is a 51 y.o. female who presents to [facility] today for issues as discussed below.  --Started WW--> has stopped drinking her Dr Reino Kent.  Eating only Salad, greenbeans, and chicken, more eggs.     In Am-- 1 egg, on 1/2 white wheat toast.     ---> Pt has lost 9 lbs in past 3 wks and her goal wt loss was 10 lbs in 4 wks---> to go on phenteramine  Got an ellipitcal  For christmas and has been doing 5 min daily plans to increase.  Wt Readings from Last 3 Encounters:  03/29/16 199 lb 12.8 oz (90.6 kg)  03/14/16 208 lb 12.8 oz (94.7 kg)  09/26/15 206 lb 4.8 oz (93.6 kg)   BP Readings from Last 3 Encounters:  03/29/16 107/71  03/14/16 102/71  09/26/15 114/78   Pulse Readings from Last 3 Encounters:  03/29/16 71  03/14/16 75  09/26/15 69   BMI Readings from Last 3 Encounters:  03/29/16 32.99 kg/m  03/14/16 34.48 kg/m  09/26/15 34.07 kg/m     Patient Care Team    Relationship Specialty Notifications Start End  Thomasene Lot, DO PCP - General Family Medicine  08/17/15     Patient Active Problem List   Diagnosis Date Noted  . Encounter for weight loss counseling 03/30/2016  . Vitamin D deficiency 10/01/2015  . History of hysterectomy - benign reasons 10/01/2015  . Continuous tobacco abuse 10/01/2015  . Adjustment disorder with mixed emotions/  depressed mood 10/01/2015  . Arthritis 10/01/2015  . Chronic pain syndrome 09/26/2015  . Tobacco abuse 08/17/2015  . Tobacco abuse counseling 08/17/2015  . Obesity (BMI 30-39.9) 08/17/2015  . Rheumatoid arthritis (HCC) 08/17/2015  . CONTACT DERMATITIS&OTHER  ECZEMA DUE UNSPEC CAUSE 03/24/2009  . Chronic Myalgias and myositis 01/08/2007    Past Medical history, Surgical history, Family history, Social history, Allergies and Medications have been entered into the medical record, reviewed and changed as needed.   Allergies:  Allergies  Allergen Reactions  . Codeine Sulfate     REACTION: Rash and nausea  . Sulfa Antibiotics Itching    And hives    ROS   Objective:   Blood pressure 107/71, pulse 71, height 5' 5.25" (1.657 m), weight 199 lb 12.8 oz (90.6 kg). Body mass index is 32.99 kg/m. General: Well Developed, well nourished, appropriate for stated age.  Neuro: Alert and oriented x3, extra-ocular muscles intact, sensation grossly intact.  HEENT: Normocephalic, atraumatic, neck supple, no carotid bruits appreciated  Skin: no gross rash. Cardiac: RRR, S1 S2 Respiratory: ECTA B/L, Not using accessory muscles, speaking in full sentences-unlabored. Vascular:  Ext warm, dry, pink; cap RF less 2 sec. Psych: No HI/SI, judgement and insight good, Euthymic mood. Full Affect.

## 2016-03-29 NOTE — Patient Instructions (Addendum)
Continue Weight Watchers as that is the best weight-loss program out there commercially available. Please continue to track every single thing you put in her mouth.  In 4 weeks after taking her phentermine please make a goal that he will lose 5% of your weight.   Just under 10 pounds for the next 4 weeks while you're on the phentermine.    Then commonly in with Tonya and if you hit your goal then start the phentermine for the next month and again it would be a 5% weight loss goal for the next month as well.      Guidelines for Losing Weight   We want weight loss that will last so you should lose 1-2 pounds a week.  THAT IS IT! Please pick THREE things a month to change. Once it is a habit check off the item. Then pick another three items off the list to become habits.  If you are already doing a habit on the list GREAT!  Cross that item off!  Don't drink your calories. Ie, alcohol, soda, fruit juice, and sweet tea.   Drink more water. Drink a glass when you feel hungry or before each meal.   Eat breakfast - Complex carb and protein (likeDannon light and fit yogurt, oatmeal, fruit, eggs, [place] bacon).  Measure your cereal.  Eat no more than one cup a day. (ie Kashi)  Eat an apple a day.  Add a vegetable a day.  Try a new vegetable a month.  Use Pam! Stop using oil or butter to cook.  Don't finish your plate or use smaller plates.  Share your dessert.  Eat sugar free Jello for dessert or frozen grapes.  Don't eat 2-3 hours before bed.  Switch to whole wheat bread, pasta, and brown rice.  Make healthier choices when you eat out. No fries!  Pick baked chicken, NOT fried.  Don't forget to SLOW DOWN when you eat. It is not going anywhere.   Take the stairs.  Park far away in the parking lot  Lift soup cans (or weights) for 10 minutes while watching TV.  Walk at work for 10 minutes during break.  Walk outside 1 time a week with your friend, kids, dog, or significant  other.  Start a walking group at church.  Walk the mall as much as you can tolerate.   Keep a food diary.  Weigh yourself daily.  Walk for 15 minutes 3 days per week.  Cook at home more often and eat out less. If life happens and you go back to old habits, it is okay.  Just start over. You can do it!  If you experience chest pain, get short of breath, or tired during the exercise, please stop immediately and inform your doctor.    Before you even begin to attack a weight-loss plan, it pays to remember this: You are not fat. You have fat. Losing weight isn't about blame or shame; it's simply another achievement to accomplish. Dieting is like any other skill-you have to buckle down and work at it. As long as you act in a smart, reasonable way, you'll ultimately get where you want to be. Here are some weight loss pearls for you.   1. It's Not a Diet. It's a Lifestyle Thinking of a diet as something you're on and suffering through only for the short term doesn't work. To shed weight and keep it off, you need to make permanent changes to the way you eat. It's  OK to indulge occasionally, of course, but if you cut calories temporarily and then revert to your old way of eating, you'll gain back the weight quicker than you can say yo-yo. Use it to lose it. Research shows that one of the best predictors of long-term weight loss is how many pounds you drop in the first month. For that reason, nutritionists often suggest being stricter for the first two weeks of your new eating strategy to build momentum. Cut out added sugar and alcohol and avoid unrefined carbs. After that, figure out how you can reincorporate them in a way that's healthy and maintainable.  2. There's a Right Way to Exercise Working out burns calories and fat and boosts your metabolism by building muscle. But those trying to lose weight are notorious for overestimating the number of calories they burn and underestimating the amount they  take in. Unfortunately, your system is biologically programmed to hold on to extra pounds and that means when you start exercising, your body senses the deficit and ramps up its hunger signals. If you're not diligent, you'll eat everything you burn and then some. Use it, to lose it. Cardio gets all the exercise glory, but strength and interval training are the real heroes. They help you build lean muscle, which in turn increases your metabolism and calorie-burning ability 3. Don't Overreact to Mild Hunger Some people have a hard time losing weight because of hunger anxiety. To them, being hungry is bad-something to be avoided at all costs-so they carry snacks with them and eat when they don't need to. Others eat because they're stressed out or bored. While you never want to get to the point of being ravenous (that's when bingeing is likely to happen), a hunger pang, a craving, or the fact that it's 3:00 p.m. should not send you racing for the vending machine or obsessing about the energy bar in your purse. Ideally, you should put off eating until your stomach is growling and it's difficult to concentrate.  Use it to lose it. When you feel the urge to eat, use the HALT method. Ask yourself, Am I really hungry? Or am I angry or anxious, lonely or bored, or tired? If you're still not certain, try the apple test. If you're truly hungry, an apple should seem delicious; if it doesn't, something else is going on. Or you can try drinking water and making yourself busy, if you are still hungry try a healthy snack.  4. Not All Calories Are Created Equal The mechanics of weight loss are pretty simple: Take in fewer calories than you use for energy. But the kind of food you eat makes all the difference. Processed food that's high in saturated fat and refined starch or sugar can cause inflammation that disrupts the hormone signals that tell your brain you're full. The result: You eat a lot more.  Use it to lose it. Clean  up your diet. Swap in whole, unprocessed foods, including vegetables, lean protein, and healthy fats that will fill you up and give you the biggest nutritional bang for your calorie buck. In a few weeks, as your brain starts receiving regular hunger and fullness signals once again, you'll notice that you feel less hungry overall and naturally start cutting back on the amount you eat.  5. Protein, Produce, and Plant-Based Fats Are Your Weight-Loss Trinity Here's why eating the three Ps regularly will help you drop pounds. Protein fills you up. You need it to build lean muscle, which keeps  your metabolism humming so that you can torch more fat. People in a weight-loss program who ate double the recommended daily allowance for protein (about 110 grams for a 150-pound woman) lost 70 percent of their weight from fat, while people who ate the RDA lost only about 40 percent, one study found. Produce is packed with filling fiber. "It's very difficult to consume too many calories if you're eating a lot of vegetables. Example: Three cups of broccoli is a lot of food, yet only 93 calories. (Fruit is another story. It can be easy to overeat and can contain a lot of calories from sugar, so be sure to monitor your intake.) Plant-based fats like olive oil and those in avocados and nuts are healthy and extra satiating.  Use it to lose it. Aim to incorporate each of the three Ps into every meal and snack. People who eat protein throughout the day are able to keep weight off, according to a study in the American Journal of Clinical Nutrition. In addition to meat, poultry and seafood, good sources are beans, lentils, eggs, tofu, and yogurt. As for fat, keep portion sizes in check by measuring out salad dressing, oil, and nut butters (shoot for one to two tablespoons). Finally, eat veggies or a little fruit at every meal. People who did that consumed 308 fewer calories but didn't feel any hungrier than when they didn't eat more  produce.  7. How You Eat Is As Important As What You Eat In order for your brain to register that you're full, you need to focus on what you're eating. Sit down whenever you eat, preferably at a table. Turn off the TV or computer, put down your phone, and look at your food. Smell it. Chew slowly, and don't put another bite on your fork until you swallow. When women ate lunch this attentively, they consumed 30 percent less when snacking later than those who listened to an audiobook at lunchtime, according to a study in the [place] Journal of Nutrition. 8. Weighing Yourself Really Works The scale provides the best evidence about whether your efforts are paying off. Seeing the numbers tick up or down or stagnate is motivation to keep going-or to rethink your approach. A 2015 study at [facility] found that daily weigh-ins helped people lose more weight, keep it off, and maintain that loss, even after two years. Use it to lose it. Step on the scale at the same time every day for the best results. If your weight shoots up several pounds from one weigh-in to the next, don't freak out. Eating a lot of salt the night before or having your period is the likely culprit. The number should return to normal in a day or two. It's a steady climb that you need to do something about. 9. Too Much Stress and Too Little Sleep Are Your Enemies When you're tired and frazzled, your body cranks up the production of cortisol, the stress hormone that can cause carb cravings. Not getting enough sleep also boosts your levels of ghrelin, a hormone associated with hunger, while suppressing leptin, a hormone that signals fullness and satiety. People on a diet who slept only five and a half hours a night for two weeks lost 55 percent less fat and were hungrier than those who slept eight and a half hours, according to a study in the [place] [employer] Journal. Use it to lose it. Prioritize sleep, aiming for seven hours or  more a night, which research shows  helps lower stress. And make sure you're getting quality zzz's. If a snoring spouse or a fidgety cat wakes you up frequently throughout the night, you may end up getting the equivalent of just four hours of sleep, according to a study from [facility]. Keep pets out of the bedroom, and use a white-noise app to drown out snoring. 10. You Will Hit a plateau-And You Can Bust Through It As you slim down, your body releases much less leptin, the fullness hormone.  If you're not strength training, start right now. Building muscle can raise your metabolism to help you overcome a plateau. To keep your body challenged and burning calories, incorporate new moves and more intense intervals into your workouts or add another sweat session to your weekly routine. Alternatively, cut an extra 100 calories or so a day from your diet. Now that you've lost weight, your body simply doesn't need as much fuel.      Since food equals calories, in order to lose weight you must either eat fewer calories, exercise more to burn off calories with activity, or both. Food that is not used to fuel the body is stored as fat. A major component of losing weight is to make smarter food choices. Here's how:  1)   Limit non-nutritious foods, such as: Sugar, honey, syrups and candy Pastries, donuts, pies, cakes and cookies Soft drinks, sweetened juices and alcoholic beverages  2)  Cut down on high-fat foods by: - Choosing poultry, fish or lean red meat - Choosing low-fat cooking methods, such as baking, broiling, steaming, grilling and boiling - Using low-fat or non-fat dairy products - Using vinaigrette, herbs, lemon or fat-free salad dressings - Avoiding fatty meats, such as bacon, sausage, franks, ribs and luncheon meats - Avoiding high-fat snacks like nuts, chips and chocolate - Avoiding fried foods - Using less butter, margarine, oil and mayonnaise - Avoiding high-fat gravies,  cream sauces and cream-based soups  3) Eat a variety of foods, including: - Fruit and vegetables that are raw, steamed or baked - Whole grains, breads, cereal, rice and pasta - Dairy products, such as low-fat or non-fat milk or yogurt, low-fat cottage cheese and low-fat cheese - Protein-rich foods like chicken, [place], fish, lean meat and legumes, or beans  4) Change your eating habits by: - Eat three balanced meals a day to help control your hunger - Watch portion sizes and eat small servings of a variety of foods - Choose low-calorie snacks - Eat only when you are hungry and stop when you are satisfied - Eat slowly and try not to perform other tasks while eating - Find other activities to distract you from food, such as walking, taking up a hobby or being involved in the community - Include regular exercise in your daily routine ( minimum of 20 min of moderate-intensity exercise at least 5 days/week)  - Find a support group, if necessary, for emotional support in your weight loss journey      Easy ways to cut 100 calories  1. Eat your eggs with hot sauce OR salsa instead of cheese.  Eggs are great for breakfast, but many people consider eggs and cheese to be BFFs. Instead of cheese-1 oz. of cheddar has 114 calories-top your eggs with hot sauce, which contains no calories and helps with satiety and metabolism. Salsa is also a great option!!  2. Top your toast, waffles or pancakes with fresh berries instead of jelly or syrup. Half a cup of berries-fresh, frozen  or thawed-has about 40 calories, compared with 2 tbsp. of maple syrup or jelly, which both have about 100 calories. The berries will also give you a good punch of fiber, which helps keep you full and satisfied and won't spike blood sugar quickly like the jelly or syrup. 3. Swap the non-fat latte for black coffee with a splash of half-and-half. Contrary to its name, that non-fat latte has 130 calories and a startling 19g of  carbohydrates per 16 oz. serving. Replacing that 'light' drinkable dessert with a black coffee with a splash of half-and-half saves you more than 100 calories per 16 oz. serving. 4. Sprinkle salads with freeze-dried raspberries instead of dried cranberries. If you want a sweet addition to your nutritious salad, stay away from dried cranberries. They have a whopping 130 calories per  cup and 30g carbohydrates. Instead, sprinkle freeze-dried raspberries guilt-free and save more than 100 calories per  cup serving, adding 3g of belly-filling fiber. 5. Go for mustard in place of mayo on your sandwich. Mustard can add really nice flavor to any sandwich, and there are tons of varieties, from spicy to honey. A serving of mayo is 95 calories, versus 10 calories in a serving of mustard.  Or try an avocado mayo spread: You can find the recipe few click this link: https://www.californiaavocado.com/recipes/recipe-container/[place]ifornia-avocado-mayo 6. Choose a DIY salad dressing instead of the store-bought kind. Mix Dijon or whole grain mustard with low-fat Kefir or red wine vinegar and garlic. 7. Use hummus as a spread instead of a dip. Use hummus as a spread on a high-fiber cracker or tortilla with a sandwich and save on calories without sacrificing taste. 8. Pick just one salad "accessory." Salad isn't automatically a calorie winner. It's easy to over-accessorize with toppings. Instead of topping your salad with nuts, avocado and cranberries (all three will clock in at 313 calories), just pick one. The next day, choose a different accessory, which will also keep your salad interesting. You don't wear all your jewelry every day, right? 9. Ditch the white pasta in favor of spaghetti squash. One cup of cooked spaghetti squash has about 40 calories, compared with traditional spaghetti, which comes with more than 200. Spaghetti squash is also nutrient-dense. It's a good source of fiber and Vitamins A and C, and it  can be eaten just like you would eat pasta-with a great tomato sauce and [place] meatballs or with pesto, tofu and spinach, for example. 10. Dress up your chili, soups and stews with non-fat [place] yogurt instead of sour cream. Just a 'dollop' of sour cream can set you back 115 calories and a whopping 12g of fat-seven of which are of the artery-clogging variety. Added bonus: [place] yogurt is packed with muscle-building protein, calcium and B Vitamins. 11. Mash cauliflower instead of mashed potatoes. One cup of traditional mashed potatoes-in all their creamy goodness-has more than 200 calories, compared to mashed cauliflower, which you can typically eat for less than 100 calories per 1 cup serving. Cauliflower is a great source of the antioxidant indole-3-carbinol (I3C), which may help reduce the risk of some cancers, like breast cancer. 12. Ditch the ice cream sundae in favor of a [place] yogurt parfait. Instead of a cup of ice cream or fro-yo for dessert, try 1 cup of nonfat Greek yogurt topped with fresh berries and a sprinkle of cacao nibs. Both toppings are packed with antioxidants, which can help reduce cellular inflammation and oxidative damage. And the comparison is a no-brainer: One cup of ice cream  has about 275 calories; one cup of frozen yogurt has about 230; and a cup of Greek yogurt has just 130, plus twice the protein, so you're less likely to return to the freezer for a second helping. 13. Put olive oil in a spray container instead of using it directly from the bottle. Each tablespoon of olive oil is 120 calories and 15g of fat. Use a mister instead of pouring it straight into the pan or onto a salad. This allows for portion control and will save you more than 100 calories. 14. When baking, substitute canned pumpkin for butter or oil. Canned pumpkin-not pumpkin pie mix-is loaded with Vitamin A, which is important for skin and eye health, as well as immunity. And the comparisons are pretty crazy:   cup of canned pumpkin has about 40 calories, compared to butter or oil, which has more than 800 calories. Yes, 800 calories. Applesauce and mashed banana can also serve as good substitutions for butter or oil, usually in a 1:1 ratio. 15. Top casseroles with high-fiber cereal instead of breadcrumbs. Breadcrumbs are typically made with white bread, while breakfast cereals contain 5-9g of fiber per serving. Not only will you save more than 150 calories per  cup serving, the swap will also keep you more full and you'll get a metabolism boost from the added fiber. 16. Snack on pistachios instead of macadamia nuts. Believe it or not, you get the same amount of calories from 35 pistachios (100 calories) as you would from only five macadamia nuts. 17. Chow down on kale chips rather than potato chips. This is my favorite 'don't knock it 'till you try it' swap. Kale chips are so easy to make at home, and you can spice them up with a little grated parmesan or chili powder. Plus, they're a mere fraction of the calories of potato chips, but with the same crunch factor we crave so often. 18. Add seltzer and some fruit slices to your cocktail instead of soda or fruit juice. One cup of soda or fruit juice can pack on as much as 140 calories. Instead, use seltzer and fruit slices. The fruit provides valuable phytochemicals, such as flavonoids and anthocyanins, which help to combat cancer and stave off the aging process.      Phentermine  While taking the medication we may ask that you come into the office once a month or once every 2-3 months to monitor your weight, blood pressure, and heart rate. In addition we can help answer your questions about diet, exercise, and help you every step of the way with your weight loss journey. Sometime it is helpful if you bring in a food diary or use an app on your phone such as myfitnesspal to record your calorie intake, especially in the beginning.   You can start out on  1/3 to 1/2 a pill in the morning and if you are tolerating it well you can increase to one pill daily. I also have some patients that take 1/3 or 1/2 at lunch to help prevent night time eating.  This medication is cheapest CASH pay at [facility] OR [facility] OR [facility] is 14-17 dollars and you do NOT need a membership to get meds from there.    What is this medicine? PHENTERMINE (FEN ter meen) decreases your appetite. This medicine is intended to be used in addition to a healthy reduced calorie diet and exercise. The best results are achieved this way. This medicine is only indicated for short-term use.  Eventually your weight loss may level out and the medication will no longer be needed.   How should I use this medicine? Take this medicine by mouth. Follow the directions on the prescription label. The tablets should stay in the bottle until immediately before you take your dose. Take your doses at regular intervals. Do not take your medicine more often than directed.  Overdosage: If you think you have taken too much of this medicine contact a poison control center or emergency room at once. NOTE: This medicine is only for you. Do not share this medicine with others.  What if I miss a dose? If you miss a dose, take it as soon as you can. If it is almost time for your next dose, take only that dose. Do not take double or extra doses. Do not increase or in any way change your dose without consulting your doctor.  What should I watch for while using this medicine? Notify your physician immediately if you become short of breath while doing your normal activities. Do not take this medicine within 6 hours of bedtime. It can keep you from getting to sleep. Avoid drinks that contain caffeine and try to stick to a regular bedtime every night. Do not stand or sit up quickly, especially if you are an older patient. This reduces the risk of dizzy or fainting spells. Avoid alcoholic drinks.  What side  effects may I notice from receiving this medicine? Side effects that you should report to your doctor or health care professional as soon as possible: -chest pain, palpitations -depression or severe changes in mood -increased blood pressure -irritability -nervousness or restlessness -severe dizziness -shortness of breath -problems urinating -unusual swelling of the legs -vomiting  Side effects that usually do not require medical attention (report to your doctor or health care professional if they continue or are bothersome): -blurred vision or other eye problems -changes in sexual ability or desire -constipation or diarrhea -difficulty sleeping -dry mouth or unpleasant taste -headache -nausea This list may not describe all possible side effects. Call your doctor for medical advice about side effects. You may report side effects to FDA at 1-800-FDA-1088.

## 2016-03-30 DIAGNOSIS — Z713 Dietary counseling and surveillance: Secondary | ICD-10-CM | POA: Insufficient documentation

## 2016-03-30 NOTE — Assessment & Plan Note (Signed)
Weight loss counseling done risks and benefits of phentermine discussed with patient she understands those. Her goal is going to be 5% of her weight loss monthly. She will continue track her foods using her weight watchers And going to meetings.  Patient will follow up with my CMA in 4 weeks for weigh-in with Tonya.

## 2016-04-10 DIAGNOSIS — R7303 Prediabetes: Secondary | ICD-10-CM | POA: Insufficient documentation

## 2016-04-10 DIAGNOSIS — R208 Other disturbances of skin sensation: Secondary | ICD-10-CM | POA: Insufficient documentation

## 2016-04-10 NOTE — Assessment & Plan Note (Addendum)
Patient would like to entertain going on a weight loss medicine in the future.  - We discussed strategies and various medicines.  She will start by focusing on getting her diet right.  - Use lose it app or my fitness pal to track everything you put in your mouth.  - Goal: Lose 5% of your own wt per month; then we will consider weight loss meds to augment your wt loss

## 2016-04-10 NOTE — Assessment & Plan Note (Signed)
Not ready to quit.  Wants to get her weight under control first and then she will consider quitting smoking

## 2016-04-10 NOTE — Assessment & Plan Note (Signed)
For her severe myalgias and dysesthesia symptoms, trial of Neurontin.  - Slowly taper up over time.  Extensive discussion with patient about risk benefits and how to use medicines had.  - Follow up sooner then planned if problems or concerns

## 2016-04-10 NOTE — Assessment & Plan Note (Signed)
A1c today 5.7.  Counseling done regarding disease process and various treatment options.  All questions answered  Handouts given if patient desired them  Encouraged weight loss and patient feels motivated to get healthy now.

## 2016-04-25 ENCOUNTER — Ambulatory Visit (INDEPENDENT_AMBULATORY_CARE_PROVIDER_SITE_OTHER): Payer: Commercial Managed Care - HMO

## 2016-04-25 VITALS — Wt 192.2 lb

## 2016-04-25 DIAGNOSIS — E669 Obesity, unspecified: Secondary | ICD-10-CM

## 2016-04-25 NOTE — Progress Notes (Signed)
Pt here for weight check only.  Pt has lost another 7lbs!  Pt states she is only taking 1/2 tablet of phentermine.  Pt denies any nervousness, anxiousness, dizziness, headaches or palpitations with phentermine. Advised pt to f/u for weight check in 1 month.  Pt expressed understanding and is agreeable.  Tiajuana Amass, CMA

## 2016-05-21 ENCOUNTER — Telehealth: Payer: Self-pay | Admitting: Family Medicine

## 2016-05-21 NOTE — Telephone Encounter (Signed)
Patient cancelled her upcoming nurse visit for weight check, she got a bill for the first one and it was $40 (insurance isn't going to cover it). She said that she will keep a log of weights at home and bring them to her f/u appts.

## 2016-05-23 ENCOUNTER — Ambulatory Visit: Payer: Commercial Managed Care - HMO

## 2016-06-25 DIAGNOSIS — Z1231 Encounter for screening mammogram for malignant neoplasm of breast: Secondary | ICD-10-CM | POA: Diagnosis not present

## 2016-06-25 DIAGNOSIS — Z01419 Encounter for gynecological examination (general) (routine) without abnormal findings: Secondary | ICD-10-CM | POA: Diagnosis not present

## 2016-06-26 LAB — HM MAMMOGRAPHY

## 2016-07-11 DIAGNOSIS — Z13820 Encounter for screening for osteoporosis: Secondary | ICD-10-CM | POA: Diagnosis not present

## 2016-07-30 ENCOUNTER — Ambulatory Visit (INDEPENDENT_AMBULATORY_CARE_PROVIDER_SITE_OTHER): Payer: Commercial Managed Care - HMO | Admitting: Family Medicine

## 2016-07-30 ENCOUNTER — Encounter: Payer: Self-pay | Admitting: Family Medicine

## 2016-07-30 VITALS — BP 106/72 | HR 86 | Ht 65.25 in | Wt 181.4 lb

## 2016-07-30 DIAGNOSIS — Z720 Tobacco use: Secondary | ICD-10-CM

## 2016-07-30 DIAGNOSIS — Z716 Tobacco abuse counseling: Secondary | ICD-10-CM

## 2016-07-30 DIAGNOSIS — E663 Overweight: Secondary | ICD-10-CM | POA: Diagnosis not present

## 2016-07-30 DIAGNOSIS — Z7189 Other specified counseling: Secondary | ICD-10-CM

## 2016-07-30 MED ORDER — NALTREXONE-BUPROPION HCL ER 8-90 MG PO TB12: ORAL_TABLET | ORAL | 0 refills | Status: DC

## 2016-07-30 NOTE — Patient Instructions (Addendum)
[email or web address]  5% wt loss in 4 wks-->  9 lbs in 4 wks.      Guidelines for Losing Weight   We want weight loss that will last so you should lose 1-2 pounds a week.  THAT IS IT! Please pick THREE things a month to change. Once it is a habit check off the item. Then pick another three items off the list to become habits.  If you are already doing a habit on the list GREAT!  Cross that item off!  Don't drink your calories. Ie, alcohol, soda, fruit juice, and sweet tea.   Drink more water. Drink a glass when you feel hungry or before each meal.   Eat breakfast - Complex carb and protein (likeDannon light and fit yogurt, oatmeal, fruit, eggs, [place] bacon).  Measure your cereal.  Eat no more than one cup a day. (ie Kashi)  Eat an apple a day.  Add a vegetable a day.  Try a new vegetable a month.  Use Pam! Stop using oil or butter to cook.  Don't finish your plate or use smaller plates.  Share your dessert.  Eat sugar free Jello for dessert or frozen grapes.  Don't eat 2-3 hours before bed.  Switch to whole wheat bread, pasta, and brown rice.  Make healthier choices when you eat out. No fries!  Pick baked chicken, NOT fried.  Don't forget to SLOW DOWN when you eat. It is not going anywhere.   Take the stairs.  Park far away in the parking lot  Lift soup cans (or weights) for 10 minutes while watching TV.  Walk at work for 10 minutes during break.  Walk outside 1 time a week with your friend, kids, dog, or significant other.  Start a walking group at church.  Walk the mall as much as you can tolerate.   Keep a food diary.  Weigh yourself daily.  Walk for 15 minutes 3 days per week.  Cook at home more often and eat out less. If life happens and you go back to old habits, it is okay.  Just start over. You can do it!  If you experience chest pain, get short of breath, or tired during the exercise, please stop immediately and inform your doctor.     Before you even begin to attack a weight-loss plan, it pays to remember this: You are not fat. You have fat. Losing weight isn't about blame or shame; it's simply another achievement to accomplish. Dieting is like any other skill-you have to buckle down and work at it. As long as you act in a smart, reasonable way, you'll ultimately get where you want to be. Here are some weight loss pearls for you.   1. It's Not a Diet. It's a Lifestyle Thinking of a diet as something you're on and suffering through only for the short term doesn't work. To shed weight and keep it off, you need to make permanent changes to the way you eat. It's OK to indulge occasionally, of course, but if you cut calories temporarily and then revert to your old way of eating, you'll gain back the weight quicker than you can say yo-yo. Use it to lose it. Research shows that one of the best predictors of long-term weight loss is how many pounds you drop in the first month. For that reason, nutritionists often suggest being stricter for the first two weeks of your new eating strategy to build momentum. Cut out added sugar and alcohol  and avoid unrefined carbs. After that, figure out how you can reincorporate them in a way that's healthy and maintainable.  2. There's a Right Way to Exercise Working out burns calories and fat and boosts your metabolism by building muscle. But those trying to lose weight are notorious for overestimating the number of calories they burn and underestimating the amount they take in. Unfortunately, your system is biologically programmed to hold on to extra pounds and that means when you start exercising, your body senses the deficit and ramps up its hunger signals. If you're not diligent, you'll eat everything you burn and then some. Use it, to lose it. Cardio gets all the exercise glory, but strength and interval training are the real heroes. They help you build lean muscle, which in turn increases your  metabolism and calorie-burning ability 3. Don't Overreact to Mild Hunger Some people have a hard time losing weight because of hunger anxiety. To them, being hungry is bad-something to be avoided at all costs-so they carry snacks with them and eat when they don't need to. Others eat because they're stressed out or bored. While you never want to get to the point of being ravenous (that's when bingeing is likely to happen), a hunger pang, a craving, or the fact that it's 3:00 p.m. should not send you racing for the vending machine or obsessing about the energy bar in your purse. Ideally, you should put off eating until your stomach is growling and it's difficult to concentrate.  Use it to lose it. When you feel the urge to eat, use the HALT method. Ask yourself, Am I really hungry? Or am I angry or anxious, lonely or bored, or tired? If you're still not certain, try the apple test. If you're truly hungry, an apple should seem delicious; if it doesn't, something else is going on. Or you can try drinking water and making yourself busy, if you are still hungry try a healthy snack.  4. Not All Calories Are Created Equal The mechanics of weight loss are pretty simple: Take in fewer calories than you use for energy. But the kind of food you eat makes all the difference. Processed food that's high in saturated fat and refined starch or sugar can cause inflammation that disrupts the hormone signals that tell your brain you're full. The result: You eat a lot more.  Use it to lose it. Clean up your diet. Swap in whole, unprocessed foods, including vegetables, lean protein, and healthy fats that will fill you up and give you the biggest nutritional bang for your calorie buck. In a few weeks, as your brain starts receiving regular hunger and fullness signals once again, you'll notice that you feel less hungry overall and naturally start cutting back on the amount you eat.  5. Protein, Produce, and Plant-Based Fats Are Your  Weight-Loss Trinity Here's why eating the three Ps regularly will help you drop pounds. Protein fills you up. You need it to build lean muscle, which keeps your metabolism humming so that you can torch more fat. People in a weight-loss program who ate double the recommended daily allowance for protein (about 110 grams for a 150-pound woman) lost 70 percent of their weight from fat, while people who ate the RDA lost only about 40 percent, one study found. Produce is packed with filling fiber. "It's very difficult to consume too many calories if you're eating a lot of vegetables. Example: Three cups of broccoli is a lot of food, yet only  93 calories. (Fruit is another story. It can be easy to overeat and can contain a lot of calories from sugar, so be sure to monitor your intake.) Plant-based fats like olive oil and those in avocados and nuts are healthy and extra satiating.  Use it to lose it. Aim to incorporate each of the three Ps into every meal and snack. People who eat protein throughout the day are able to keep weight off, according to a study in the [place] of Clinical Nutrition. In addition to meat, poultry and seafood, good sources are beans, lentils, eggs, tofu, and yogurt. As for fat, keep portion sizes in check by measuring out salad dressing, oil, and nut butters (shoot for one to two tablespoons). Finally, eat veggies or a little fruit at every meal. People who did that consumed 308 fewer calories but didn't feel any hungrier than when they didn't eat more produce.  7. How You Eat Is As Important As What You Eat In order for your brain to register that you're full, you need to focus on what you're eating. Sit down whenever you eat, preferably at a table. Turn off the TV or computer, put down your phone, and look at your food. Smell it. Chew slowly, and don't put another bite on your fork until you swallow. When women ate lunch this attentively, they consumed 30 percent less when  snacking later than those who listened to an audiobook at lunchtime, according to a study in the [place] of Nutrition. 8. Weighing Yourself Really Works The scale provides the best evidence about whether your efforts are paying off. Seeing the numbers tick up or down or stagnate is motivation to keep going-or to rethink your approach. A 2015 study at [facility] found that daily weigh-ins helped people lose more weight, keep it off, and maintain that loss, even after two years. Use it to lose it. Step on the scale at the same time every day for the best results. If your weight shoots up several pounds from one weigh-in to the next, don't freak out. Eating a lot of salt the night before or having your period is the likely culprit. The number should return to normal in a day or two. It's a steady climb that you need to do something about. 9. Too Much Stress and Too Little Sleep Are Your Enemies When you're tired and frazzled, your body cranks up the production of cortisol, the stress hormone that can cause carb cravings. Not getting enough sleep also boosts your levels of ghrelin, a hormone associated with hunger, while suppressing leptin, a hormone that signals fullness and satiety. People on a diet who slept only five and a half hours a night for two weeks lost 55 percent less fat and were hungrier than those who slept eight and a half hours, according to a study in the [place]. Use it to lose it. Prioritize sleep, aiming for seven hours or more a night, which research shows helps lower stress. And make sure you're getting quality zzz's. If a snoring spouse or a fidgety cat wakes you up frequently throughout the night, you may end up getting the equivalent of just four hours of sleep, according to a study from [facility]. Keep pets out of the bedroom, and use a white-noise app to drown out snoring. 10. You Will Hit a plateau-And You Can Bust Through It As  you slim down, your body releases much less leptin, the fullness hormone.  If you're not strength training, start right now. Building muscle can raise your metabolism to help you overcome a plateau. To keep your body challenged and burning calories, incorporate new moves and more intense intervals into your workouts or add another sweat session to your weekly routine. Alternatively, cut an extra 100 calories or so a day from your diet. Now that you've lost weight, your body simply doesn't need as much fuel.      Since food equals calories, in order to lose weight you must either eat fewer calories, exercise more to burn off calories with activity, or both. Food that is not used to fuel the body is stored as fat. A major component of losing weight is to make smarter food choices. Here's how:  1)   Limit non-nutritious foods, such as: Sugar, honey, syrups and candy Pastries, donuts, pies, cakes and cookies Soft drinks, sweetened juices and alcoholic beverages  2)  Cut down on high-fat foods by: - Choosing poultry, fish or lean red meat - Choosing low-fat cooking methods, such as baking, broiling, steaming, grilling and boiling - Using low-fat or non-fat dairy products - Using vinaigrette, herbs, lemon or fat-free salad dressings - Avoiding fatty meats, such as bacon, sausage, franks, ribs and luncheon meats - Avoiding high-fat snacks like nuts, chips and chocolate - Avoiding fried foods - Using less butter, margarine, oil and mayonnaise - Avoiding high-fat gravies, cream sauces and cream-based soups  3) Eat a variety of foods, including: - Fruit and vegetables that are raw, steamed or baked - Whole grains, breads, cereal, rice and pasta - Dairy products, such as low-fat or non-fat milk or yogurt, low-fat cottage cheese and low-fat cheese - Protein-rich foods like chicken, [place], fish, lean meat and legumes, or beans  4) Change your eating habits by: - Eat three balanced meals a day to  help control your hunger - Watch portion sizes and eat small servings of a variety of foods - Choose low-calorie snacks - Eat only when you are hungry and stop when you are satisfied - Eat slowly and try not to perform other tasks while eating - Find other activities to distract you from food, such as walking, taking up a hobby or being involved in the community - Include regular exercise in your daily routine ( minimum of 20 min of moderate-intensity exercise at least 5 days/week)  - Find a support group, if necessary, for emotional support in your weight loss journey         Easy ways to cut 100 calories   1. Eat your eggs with hot sauce OR salsa instead of cheese.  Eggs are great for breakfast, but many people consider eggs and cheese to be BFFs. Instead of cheese-1 oz. of cheddar has 114 calories-top your eggs with hot sauce, which contains no calories and helps with satiety and metabolism. Salsa is also a great option!!  2. Top your toast, waffles or pancakes with fresh berries instead of jelly or syrup. Half a cup of berries-fresh, frozen or thawed-has about 40 calories, compared with 2 tbsp. of maple syrup or jelly, which both have about 100 calories. The berries will also give you a good punch of fiber, which helps keep you full and satisfied and won't spike blood sugar quickly like the jelly or syrup. 3. Swap the non-fat latte for black coffee with a splash of half-and-half. Contrary to its name, that non-fat latte has 130 calories and a startling 19g of carbohydrates per 16 oz. serving.  Replacing that 'light' drinkable dessert with a black coffee with a splash of half-and-half saves you more than 100 calories per 16 oz. serving. 4. Sprinkle salads with freeze-dried raspberries instead of dried cranberries. If you want a sweet addition to your nutritious salad, stay away from dried cranberries. They have a whopping 130 calories per  cup and 30g carbohydrates. Instead, sprinkle  freeze-dried raspberries guilt-free and save more than 100 calories per  cup serving, adding 3g of belly-filling fiber. 5. Go for mustard in place of mayo on your sandwich. Mustard can add really nice flavor to any sandwich, and there are tons of varieties, from spicy to honey. A serving of mayo is 95 calories, versus 10 calories in a serving of mustard.  Or try an avocado mayo spread: You can find the recipe few click this link: https://www.californiaavocado.com/recipes/recipe-container/[place]-avocado-mayo 6. Choose a DIY salad dressing instead of the store-bought kind. Mix Dijon or whole grain mustard with low-fat Kefir or red wine vinegar and garlic. 7. Use hummus as a spread instead of a dip. Use hummus as a spread on a high-fiber cracker or tortilla with a sandwich and save on calories without sacrificing taste. 8. Pick just one salad "accessory." Salad isn't automatically a calorie winner. It's easy to over-accessorize with toppings. Instead of topping your salad with nuts, avocado and cranberries (all three will clock in at 313 calories), just pick one. The next day, choose a different accessory, which will also keep your salad interesting. You don't wear all your jewelry every day, right? 9. Ditch the white pasta in favor of spaghetti squash. One cup of cooked spaghetti squash has about 40 calories, compared with traditional spaghetti, which comes with more than 200. Spaghetti squash is also nutrient-dense. It's a good source of fiber and Vitamins A and C, and it can be eaten just like you would eat pasta-with a great tomato sauce and [place] meatballs or with pesto, tofu and spinach, for example. 10. Dress up your chili, soups and stews with non-fat [place] yogurt instead of sour cream. Just a 'dollop' of sour cream can set you back 115 calories and a whopping 12g of fat-seven of which are of the artery-clogging variety. Added bonus: [place] yogurt is packed with muscle-building protein,  calcium and B Vitamins. 11. Mash cauliflower instead of mashed potatoes. One cup of traditional mashed potatoes-in all their creamy goodness-has more than 200 calories, compared to mashed cauliflower, which you can typically eat for less than 100 calories per 1 cup serving. Cauliflower is a great source of the antioxidant indole-3-carbinol (I3C), which may help reduce the risk of some cancers, like breast cancer. 12. Ditch the ice cream sundae in favor of a [place] yogurt parfait. Instead of a cup of ice cream or fro-yo for dessert, try 1 cup of nonfat Greek yogurt topped with fresh berries and a sprinkle of cacao nibs. Both toppings are packed with antioxidants, which can help reduce cellular inflammation and oxidative damage. And the comparison is a no-brainer: One cup of ice cream has about 275 calories; one cup of frozen yogurt has about 230; and a cup of Greek yogurt has just 130, plus twice the protein, so you're less likely to return to the freezer for a second helping. 13. Put olive oil in a spray container instead of using it directly from the bottle. Each tablespoon of olive oil is 120 calories and 15g of fat. Use a mister instead of pouring it straight into the pan or onto a salad. This  allows for portion control and will save you more than 100 calories. 14. When baking, substitute canned pumpkin for butter or oil. Canned pumpkin-not pumpkin pie mix-is loaded with Vitamin A, which is important for skin and eye health, as well as immunity. And the comparisons are pretty crazy:  cup of canned pumpkin has about 40 calories, compared to butter or oil, which has more than 800 calories. Yes, 800 calories. Applesauce and mashed banana can also serve as good substitutions for butter or oil, usually in a 1:1 ratio. 15. Top casseroles with high-fiber cereal instead of breadcrumbs. Breadcrumbs are typically made with white bread, while breakfast cereals contain 5-9g of fiber per serving. Not only will you  save more than 150 calories per  cup serving, the swap will also keep you more full and you'll get a metabolism boost from the added fiber. 16. Snack on pistachios instead of macadamia nuts. Believe it or not, you get the same amount of calories from 35 pistachios (100 calories) as you would from only five macadamia nuts. 17. Chow down on kale chips rather than potato chips. This is my favorite 'don't knock it 'till you try it' swap. Kale chips are so easy to make at home, and you can spice them up with a little grated parmesan or chili powder. Plus, they're a mere fraction of the calories of potato chips, but with the same crunch factor we crave so often. 18. Add seltzer and some fruit slices to your cocktail instead of soda or fruit juice. One cup of soda or fruit juice can pack on as much as 140 calories. Instead, use seltzer and fruit slices. The fruit provides valuable phytochemicals, such as flavonoids and anthocyanins, which help to combat cancer and stave off the aging process.

## 2016-07-30 NOTE — Assessment & Plan Note (Signed)
contrave script   Lose it or my fitness pal

## 2016-07-30 NOTE — Assessment & Plan Note (Signed)
Not interested in quitting right now---> wants to lose wt first.

## 2016-07-30 NOTE — Progress Notes (Signed)
Wt loss OV note   Impression and Recommendations:    1. Overweight (BMI 25.0-29.9)   2. Tobacco abuse   3. Tobacco abuse counseling   4. Counseling on health promotion and disease prevention      Tobacco abuse Not interested in quitting right now---> wants to lose wt first.  Overweight (BMI 25.0-29.9) contrave script   Lose it or my fitness pal  Goal:  5% wt loss in 4 wks-->  9 lbs wt loss  in 4 wks.    The patient was counseled, risk factors were discussed, anticipatory guidance given.  -Gross side effects, risk and benefits, and alternatives of medications discussed with patient.  Patient is aware that all medications have potential side effects and we are unable to predict every side effect or drug-drug interaction that may occur.  Expresses verbal understanding and consents to current therapy plan and treatment regimen.  Meds ordered this encounter  Medications  . Naltrexone-Bupropion HCl ER 8-90 MG TB12    Sig: 1 tab daily for week 1, then 1 tab BID for week 2, then 2 tab PO qAM and 1 tab PO qPM for week 3, then 2 tabs BID.    Dispense:  80 tablet    Refill:  0     Discontinued Medications   GABAPENTIN (NEURONTIN) 100 MG CAPSULE    Take 1 capsule (100 mg total) by mouth 3 (three) times daily.   PHENTERMINE (ADIPEX-P) 37.5 MG TABLET    Take 1 tablet (37.5 mg total) by mouth daily before breakfast.   Return in about 4 weeks (around 08/27/2016) for wt loss.   Please see AVS handed out to patient at the end of our visit for further patient instructions/ counseling done pertaining to today's office visit.    Note: This document was prepared using Dragon voice recognition software and may include unintentional dictation errors.  Ariam Mol 11:08 PM   ______________________________________________________________________    Subjective:  HPI: 20 D Brady46 y.o. female  presents for 3 month follow up for multiple medical problems.  Started in  dec- dieting.  Lost 28lbs.   Took one bottle of phenteramine and no sign change after a month.  After 1-2 wks, noticed no difference in appetitie.  Now stuck at 180 pounds for about one month- and unsure what to do.   Exercise:  walk or elliptical  Food/drink tracking:   She was but eating same thing- chicken , salad, yogurt, protein shake, green beans.   mammo and GYn exam-->  gettign cologuard done;  Bone density was N.   --> pain in jts etc much better with exercise and wt loss.    Weight:  Wt Readings from Last 3 Encounters:  07/30/16 181 lb 6.4 oz (82.3 kg)  04/25/16 192 lb 3.2 oz (87.2 kg)  03/29/16 199 lb 12.8 oz (90.6 kg)   BMI Readings from Last 3 Encounters:  07/30/16 29.96 kg/m  04/25/16 31.74 kg/m  03/29/16 32.99 kg/m   Lab Results  Component Value Date   HGBA1C 5.7 03/14/2016   HGBA1C 5.6 09/05/2015    Review of Systems: General:   No F/C, wt loss Pulm:   No DIB, SOB, pleuritic chest pain Card:  No CP, palpitations Abd:  No n/v/d or pain Ext:  No inc edema from baseline   Objective: Physical Exam: BP 106/72   Pulse 86   Ht 5' 5.25" (1.657 m)   Wt 181 lb 6.4 oz (82.3 kg)   BMI  29.96 kg/m  Body mass index is 29.96 kg/m. General: Well nourished, in no apparent distress. Eyes: PERRLA, EOMs, conjunctiva clr no swelling or erythema ENT/Mouth: Hearing appears normal.  Mucus Membranes Moist  Neck: Supple, no masses Resp: Respiratory effort- normal, ECTA B/L w/o W/R/R  Cardio: RRR w/o MRGs. Abdomen: no gross distention. Lymphatics:  Brisk peripheral pulses, less 2 sec cap RF, no gross edema  M-sk: Full ROM, 5/5 strength, normal gait.  Skin: Warm, dry without rashes, lesions, ecchymosis.  Neuro: Alert, Oriented Psych: Normal affect, Insight and Judgment appropriate.    Current Medications:  Current Outpatient Prescriptions on File Prior to Visit  Medication Sig Dispense Refill  . Cholecalciferol (VITAMIN D3) 5000 units TABS Take 1 tablet by mouth  daily.    Marland Kitchen ibuprofen (ADVIL,MOTRIN) 200 MG tablet Take 400 mg by mouth every 6 (six) hours as needed.    . Vitamin D, Ergocalciferol, (DRISDOL) 50000 units CAPS capsule Take 1 capsule (50,000 Units total) by mouth every 7 (seven) days. 12 capsule 2   No current facility-administered medications on file prior to visit.     Medical History:  Patient Active Problem List   Diagnosis Date Noted  . Prediabetes\ glucose intolerance 04/10/2016    Priority: High  . 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 Overweight (BMI 25.0-29.9) 08/17/2015    Priority: High  . Rheumatoid arthritis (HCC) 08/17/2015    Priority: High  . History of hysterectomy - benign reasons 10/01/2015    Priority: Medium  . Chronic pain syndrome 09/26/2015    Priority: Medium  . Muscle pain, myofacial 01/08/2007    Priority: Medium  . Vitamin D deficiency 10/01/2015    Priority: Low  . Continuous tobacco abuse 10/01/2015    Priority: Low  . Tobacco abuse 08/17/2015    Priority: Low  . Tobacco abuse counseling 08/17/2015    Priority: Low  . Counseling on health promotion and disease prevention 07/30/2016  . Dysesthesia affecting both sides of body 04/10/2016  . Encounter for weight loss counseling 03/30/2016  . Arthritis 10/01/2015  . CONTACT DERMATITIS&OTHER ECZEMA DUE UNSPEC CAUSE 03/24/2009    Allergies:  Allergies  Allergen Reactions  . Codeine Sulfate     REACTION: Rash and nausea  . Sulfa Antibiotics Itching    And hives     Family history-  Reviewed; changed as appropriate  Social history-  Reviewed; changed as appropriate

## 2016-07-31 ENCOUNTER — Telehealth: Payer: Self-pay

## 2016-07-31 NOTE — Telephone Encounter (Signed)
Informed pt that insurance company denied [place] for Contrave, stating that it was a plan exclusion.  Per Dr. Sharee Holster, Qsymia or phentermine offered to pt.  Pt states that she will check with pharmacy on cost and will advise Mary Shaffer if she would like to try either Qsymia or go back on the phentermine.

## 2016-08-27 ENCOUNTER — Ambulatory Visit (INDEPENDENT_AMBULATORY_CARE_PROVIDER_SITE_OTHER): Payer: Commercial Managed Care - HMO | Admitting: Family Medicine

## 2016-08-27 ENCOUNTER — Encounter: Payer: Self-pay | Admitting: Family Medicine

## 2016-08-27 VITALS — BP 114/74 | HR 78 | Ht 65.25 in | Wt 184.0 lb

## 2016-08-27 DIAGNOSIS — M79672 Pain in left foot: Secondary | ICD-10-CM

## 2016-08-27 DIAGNOSIS — G2581 Restless legs syndrome: Secondary | ICD-10-CM

## 2016-08-27 DIAGNOSIS — M109 Gout, unspecified: Secondary | ICD-10-CM

## 2016-08-27 MED ORDER — PREDNISONE 20 MG PO TABS: ORAL_TABLET | ORAL | 0 refills | Status: DC

## 2016-08-27 MED ORDER — DICLOFENAC SODIUM 1 % TD GEL: 2.0000 g | Freq: Four times a day (QID) | TRANSDERMAL | 1 refills | Status: DC

## 2016-08-27 MED ORDER — AMITRIPTYLINE HCL 25 MG PO TABS: 50.0000 mg | ORAL_TABLET | Freq: Every day | ORAL | 0 refills | Status: DC

## 2016-08-27 NOTE — Patient Instructions (Addendum)
Patient declines x-ray today and we will reevaluate in a couple weeks or she will call and let us know and we will schedule then if no improvement  -->  Told patient to start off with 1/2 ltabletof the Elavil and slowly titrate up to 50 mg as needed     Low-Purine Diet Purines are compounds that affect the level of uric acid in your body. A low-purine diet is a diet that is low in purines. Eating a low-purine diet can prevent the level of uric acid in your body from getting too high and causing gout or kidney stones or both. What do I need to know about this diet?  Choose low-purine foods. Examples of low-purine foods are listed in the next section.  Drink plenty of fluids, especially water. Fluids can help remove uric acid from your body. Try to drink 8-16 cups (1.9-3.8 L) a day.  Limit foods high in fat, especially saturated fat, as fat makes it harder for the body to get rid of uric acid. Foods high in saturated fat include pizza, cheese, ice cream, whole milk, fried foods, and gravies. Choose foods that are lower in fat and lean sources of protein. Use olive oil when cooking as it contains healthy fats that are not high in saturated fat.  Limit alcohol. Alcohol interferes with the elimination of uric acid from your body. If you are having a gout attack, avoid all alcohol.  Keep in mind that different people's bodies react differently to different foods. You will probably learn over time which foods do or do not affect you. If you discover that a food tends to cause your gout to flare up, avoid eating that food. You can more freely enjoy foods that do not cause problems. If you have any questions about a food item, talk to your dietitian or health care provider. Which foods are low, moderate, and high in purines? The following is a list of foods that are low, moderate, and high in purines. You can eat any amount of the foods that are low in purines. You may be able to have small amounts of  foods that are moderate in purines. Ask your health care provider how much of a food moderate in purines you can have. Avoid foods high in purines. Grains  Foods low in purines: Enriched white bread, pasta, rice, cake, cornbread, popcorn.  Foods moderate in purines: Whole-grain breads and cereals, wheat germ, bran, oatmeal. Uncooked oatmeal. Dry wheat bran or wheat germ.  Foods high in purines: Pancakes, [place] toast, biscuits, muffins. Vegetables  Foods low in purines: All vegetables, except those that are moderate in purines.  Foods moderate in purines: Asparagus, cauliflower, spinach, mushrooms, green peas. Fruits  All fruits are low in purines. Meats and other Protein Foods  Foods low in purines: Eggs, nuts, peanut butter.  Foods moderate in purines: 80-90% lean beef, lamb, veal, pork, poultry, fish, eggs, peanut butter, nuts. Crab, lobster, oysters, and shrimp. Cooked dried beans, peas, and lentils.  Foods high in purines: Anchovies, sardines, herring, mussels, tuna, codfish, scallops, trout, and haddock. Tomasa Blase. Organ meats (such as liver or kidney). Tripe. Game meat. Goose. Sweetbreads. Dairy  All dairy foods are low in purines. Low-fat and fat-free dairy products are best because they are low in saturated fat. Beverages  Drinks low in purines: Water, carbonated beverages, tea, coffee, cocoa.  Drinks moderate in purines: Soft drinks and other drinks sweetened with high-fructose corn syrup. Juices. To find whether a food or drink  is sweetened with high-fructose corn syrup, look at the ingredients list.  Drinks high in purines: Alcoholic beverages (such as beer). Condiments  Foods low in purines: Salt, herbs, olives, pickles, relishes, vinegar.  Foods moderate in purines: Butter, margarine, oils, mayonnaise. Fats and Oils  Foods low in purines: All types, except gravies and sauces made with meat.  Foods high in purines: Gravies and sauces made with meat. Other  Foods  Foods low in purines: Sugars, sweets, gelatin. Cake. Soups made without meat.  Foods moderate in purines: Meat-based or fish-based soups, broths, or bouillons. Foods and drinks sweetened with high-fructose corn syrup.  Foods high in purines: High-fat desserts (such as ice cream, cookies, cakes, pies, doughnuts, and chocolate). Contact your dietitian for more information on foods that are not listed here. This information is not intended to replace advice given to you by your health care provider. Make sure you discuss any questions you have with your health care provider. Document Released: 06/29/2010 Document Revised: 08/10/2015 Document Reviewed: 02/08/2013 Elsevier Interactive Patient Education  2017 Elsevier Inc.   Gout Gout is painful swelling that can occur in some of your joints. Gout is a type of arthritis. This condition is caused by having too much uric acid in your body. Uric acid is a chemical that forms when your body breaks down substances called purines. Purines are important for building body proteins. When your body has too much uric acid, sharp crystals can form and build up inside your joints. This causes pain and swelling. Gout attacks can happen quickly and be very painful (acute gout). Over time, the attacks can affect more joints and become more frequent (chronic gout). Gout can also cause uric acid to build up under your skin and inside your kidneys. What are the causes? This condition is caused by too much uric acid in your blood. This can occur because:  Your kidneys do not remove enough uric acid from your blood. This is the most common cause.  Your body makes too much uric acid. This can occur with some cancers and cancer treatments. It can also occur if your body is breaking down too many red blood cells (hemolytic anemia).  You eat too many foods that are high in purines. These foods include organ meats and some seafood. Alcohol, especially beer, is also  high in purines.  A gout attack may be triggered by trauma or stress. What increases the risk? This condition is more likely to develop in people who:  Have a family history of gout.  Are female and middle-aged.  Are female and have gone through menopause.  Are obese.  Frequently drink alcohol, especially beer.  Are dehydrated.  Lose weight too quickly.  Have an organ transplant.  Have lead poisoning.  Take certain medicines, including aspirin, cyclosporine, diuretics, levodopa, and niacin.  Have kidney disease or psoriasis.  What are the signs or symptoms? An attack of acute gout happens quickly. It usually occurs in just one joint. The most common place is the big toe. Attacks often start at night. Other joints that may be affected include joints of the feet, ankle, knee, fingers, wrist, or elbow. Symptoms may include:  Severe pain.  Warmth.  Swelling.  Stiffness.  Tenderness. The affected joint may be very painful to touch.  Shiny, red, or purple skin.  Chills and fever.  Chronic gout may cause symptoms more frequently. More joints may be involved. You may also have white or yellow lumps (tophi) on your hands or  feet or in other areas near your joints. How is this diagnosed? This condition is diagnosed based on your symptoms, medical history, and physical exam. You may have tests, such as:  Blood tests to measure uric acid levels.  Removal of joint fluid with a needle (aspiration) to look for uric acid crystals.  X-rays to look for joint damage.  How is this treated? Treatment for this condition has two phases: treating an acute attack and preventing future attacks. Acute gout treatment may include medicines to reduce pain and swelling, including:  NSAIDs.  Steroids. These are strong anti-inflammatory medicines that can be taken by mouth (orally) or injected into a joint.  Colchicine. This medicine relieves pain and swelling when it is taken soon after  an attack. It can be given orally or through an IV tube.  Preventive treatment may include:  Daily use of smaller doses of NSAIDs or colchicine.  Use of a medicine that reduces uric acid levels in your blood.  Changes to your diet. You may need to see a specialist about healthy eating (dietitian).  Follow these instructions at home: During a Gout Attack  If directed, apply ice to the affected area: ? Put ice in a plastic bag. ? Place a towel between your skin and the bag. ? Leave the ice on for 20 minutes, 2-3 times a day.  Rest the joint as much as possible. If the affected joint is in your leg, you may be given crutches to use.  Raise (elevate) the affected joint above the level of your heart as often as possible.  Drink enough fluids to keep your urine clear or pale yellow.  Take over-the-counter and prescription medicines only as told by your health care provider.  Do not drive or operate heavy machinery while taking prescription pain medicine.  Follow instructions from your health care provider about eating or drinking restrictions.  Return to your normal activities as told by your health care provider. Ask your health care provider what activities are safe for you. Avoiding Future Gout Attacks  Follow a low-purine diet as told by your dietitian or health care provider. Avoid foods and drinks that are high in purines, including liver, kidney, anchovies, asparagus, herring, mushrooms, mussels, and beer.  Limit alcohol intake to no more than 1 drink a day for nonpregnant women and 2 drinks a day for men. One drink equals 12 oz of beer, 5 oz of wine, or 1 oz of hard liquor.  Maintain a healthy weight or lose weight if you are overweight. If you want to lose weight, talk with your health care provider. It is important that you do not lose weight too quickly.  Start or maintain an exercise program as told by your health care provider.  Drink enough fluids to keep your urine  clear or pale yellow.  Take over-the-counter and prescription medicines only as told by your health care provider.  Keep all follow-up visits as told by your health care provider. This is important. Contact a health care provider if:  You have another gout attack.  You continue to have symptoms of a gout attack after10 days of treatment.  You have side effects from your medicines.  You have chills or a fever.  You have burning pain when you urinate.  You have pain in your lower back or belly. Get help right away if:  You have severe or uncontrolled pain.  You cannot urinate. This information is not intended to replace advice given to  you by your health care provider. Make sure you discuss any questions you have with your health care provider. Document Released: 03/01/2000 Document Revised: 08/10/2015 Document Reviewed: 12/15/2014 Elsevier Interactive Patient Education  2017 [employer].

## 2016-08-27 NOTE — Progress Notes (Signed)
Pt here for an acute care OV today   Impression and Recommendations:    1. 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 Left foot pain      No problem-specific Assessment & Plan notes found for this encounter.   The patient was counseled, risk factors were discussed, anticipatory guidance given.  New Prescriptions   DICLOFENAC SODIUM (VOLTAREN) 1 % GEL    Apply 2 g topically 4 (four) times daily. To affected joint.   PREDNISONE (DELTASONE) 20 MG TABLET    Take 3 pills a day for 2 days, 2 pills a day for 2 days, 1 pill a day for 2 days then one half pill a day for 2 days then off    Discontinued Medications   No medications on file     Orders Placed This Encounter  Procedures  . CBC with Differential/Platelet  . Uric acid     Gross side effects, risk and benefits, and alternatives of medications and treatment plan in general discussed with patient.  Patient is aware that all medications have potential side effects and we are unable to predict every side effect or drug-drug interaction that may occur.   Patient will call with any questions prior to using medication if they have concerns.  Expresses verbal understanding and consents to current therapy and treatment regimen.  No barriers to understanding were identified.  Red flag symptoms and signs discussed in detail.  Patient expressed understanding regarding what to do in case of emergency\urgent symptoms  Please see AVS handed out to patient at the end of our visit for further patient instructions/ counseling done pertaining to today's office visit.   Return if symptoms worsen or fail to improve, for 6 wks from now- for wt loss, but track foods.     Note: This document was prepared using Dragon voice recognition software and may include unintentional dictation errors.  Thomasene Lot 3:52  PM --------------------------------------------------------------------------------------------------------------------------------------------------------------------------------------------------------------------------------------------    Subjective:    CC:  Chief Complaint  Patient presents with  . Weight Check    just got back from vacation    HPI: Mary Shaffer is a 51 y.o. female who presents to [facility] today for issues as discussed below.   patient did not start her contrave until yesterday.  I will see her in approximately weeks from the date she starte the medicine and her goal will be 5% o her weight.       Here today to discuss foot pain.  2 wks ago--> thought it was flare of her RA--> sharp stabbing pains in midfoot medially- right above arch-  Especially at night.  Sore to walk on it and weight-bear during the day.  Feels like it radiates from arch up to tiop of foot. . No injury, no fall.  No prior pain like this before.  No new shoes , no new exercises. No h/o Gout.  Walking on it - worse and better- with nothing. Voltren gel helps a little.    a year to patient has had some restless leg syndrome type symptoms.  More recently has been worse for the past 6 months.  No problems updated.   Wt Readings from Last 3 Encounters:  08/27/16 184 lb (83.5 kg)  07/30/16 181 lb 6.4 oz (82.3 kg)  04/25/16 192 lb 3.2 oz (87.2 kg)   BP Readings from Last 3 Encounters:  08/27/16 114/74  07/30/16 106/72  03/29/16 107/71   BMI Readings  from Last 3 Encounters:  08/27/16 30.39 kg/m  07/30/16 29.96 kg/m  04/25/16 31.74 kg/m     Patient Care Team    Relationship Specialty Notifications Start End  Thomasene Lot, DO PCP - General Family Medicine  08/17/15      Patient Active Problem List   Diagnosis Date Noted  . Prediabetes\ glucose intolerance 04/10/2016    Priority: High  . Adjustment disorder with mixed emotions/ depressed mood  10/01/2015    Priority: High  . Overweight (BMI 25.0-29.9) 08/17/2015    Priority: High  . Rheumatoid arthritis (HCC) 08/17/2015    Priority: High  . History of hysterectomy - benign reasons 10/01/2015    Priority: Medium  . Chronic pain syndrome 09/26/2015    Priority: Medium  . Muscle pain, myofacial 01/08/2007    Priority: Medium  . Vitamin D deficiency 10/01/2015    Priority: Low  . Continuous tobacco abuse 10/01/2015    Priority: Low  . Tobacco abuse 08/17/2015    Priority: Low  . Tobacco abuse counseling 08/17/2015    Priority: Low  . Counseling on health promotion and disease prevention 07/30/2016  . Dysesthesia affecting both sides of body 04/10/2016  . Encounter for weight loss counseling 03/30/2016  . Arthritis 10/01/2015  . CONTACT DERMATITIS&OTHER ECZEMA DUE UNSPEC CAUSE 03/24/2009    Past Medical history, Surgical history, Family history, Social history, Allergies and Medications have been entered into the medical record, reviewed and changed as needed.    Current Meds  Medication Sig  . Cholecalciferol (VITAMIN D3) 5000 units TABS Take 1 tablet by mouth daily.  Marland Kitchen ibuprofen (ADVIL,MOTRIN) 200 MG tablet Take 400 mg by mouth every 6 (six) hours as needed.  . Naltrexone-Bupropion HCl ER 8-90 MG TB12 1 tab daily for week 1, then 1 tab BID for week 2, then 2 tab PO qAM and 1 tab PO qPM for week 3, then 2 tabs BID.  Marland Kitchen Vitamin D, Ergocalciferol, (DRISDOL) 50000 units CAPS capsule Take 1 capsule (50,000 Units total) by mouth every 7 (seven) days.    Allergies:  Allergies  Allergen Reactions  . Codeine Sulfate     REACTION: Rash and nausea  . Sulfa Antibiotics Itching    And hives     Review of Systems: General:   Denies fever, chills, unexplained weight loss.  Optho/Auditory:   Denies visual changes, blurred vision/LOV Respiratory:   Denies wheeze, DOE more than baseline levels.  Cardiovascular:   Denies chest pain, palpitations, new onset peripheral edema    Gastrointestinal:   Denies nausea, vomiting, diarrhea, abd pain.  Genitourinary: Denies dysuria, freq/ urgency, flank pain or discharge from genitals.  Endocrine:     Denies hot or cold intolerance, polyuria, polydipsia. Musculoskeletal:   Denies unexplained myalgias, joint swelling, unexplained arthralgias, gait problems.  Skin:  Denies new onset rash, suspicious lesions Neurological:     Denies dizziness, unexplained weakness, numbness  Psychiatric/Behavioral:   Denies mood changes, suicidal or homicidal ideations, hallucinations    Objective:   Blood pressure 114/74, pulse 78, height 5' 5.25" (1.657 m), weight 184 lb (83.5 kg). Body mass index is 30.39 kg/m. General:  Well Developed, well nourished, appropriate for stated age.  Neuro:  Alert and oriented,  extra-ocular muscles intact  HEENT:  Normocephalic, atraumatic, neck supple Skin:  no gross rash, warm, pink. Cardiac:  RRR, S1 S2 Respiratory:  ECTA B/L and A/P, Not using accessory muscles, speaking in full sentences- unlabored. Vascular:  Ext warm, no cyanosis apprec.; cap RF less  2 sec. Psych:  No HI/SI, judgement and insight good, Euthymic mood. Full Affect.

## 2016-08-28 LAB — CBC WITH DIFFERENTIAL/PLATELET
BASOS ABS: 0 10*3/uL (ref 0.0–0.2)
Basos: 1 %
EOS (ABSOLUTE): 0.3 10*3/uL (ref 0.0–0.4)
EOS: 4 %
Hematocrit: 46.7 % — ABNORMAL HIGH (ref 34.0–46.6)
Hemoglobin: 15.4 g/dL (ref 11.1–15.9)
IMMATURE GRANULOCYTES: 0 %
Immature Grans (Abs): 0 10*3/uL (ref 0.0–0.1)
Lymphocytes Absolute: 2.3 10*3/uL (ref 0.7–3.1)
Lymphs: 30 %
MCH: 30.7 pg (ref 26.6–33.0)
MCHC: 33 g/dL (ref 31.5–35.7)
MCV: 93 fL (ref 79–97)
MONOS ABS: 0.5 10*3/uL (ref 0.1–0.9)
Monocytes: 6 %
NEUTROS PCT: 59 %
Neutrophils Absolute: 4.5 10*3/uL (ref 1.4–7.0)
PLATELETS: 279 10*3/uL (ref 150–379)
RBC: 5.01 x10E6/uL (ref 3.77–5.28)
RDW: 13.5 % (ref 12.3–15.4)
WBC: 7.6 10*3/uL (ref 3.4–10.8)

## 2016-08-28 LAB — URIC ACID: URIC ACID: 3.2 mg/dL (ref 2.5–7.1)

## 2016-09-04 ENCOUNTER — Telehealth: Payer: Self-pay | Admitting: Family Medicine

## 2016-09-04 NOTE — Telephone Encounter (Signed)
Patient states foot is not any better even after meds. She was told to call and follow up and wants to speak with someone clinical with what to do next, she states Dr. Val Eagle had mentioned an xray, and is curious if this still stands.

## 2016-09-04 NOTE — Telephone Encounter (Signed)
Please advise what you would like the patient to do.  Thanks.  MPulliam, CMA

## 2016-09-05 NOTE — Telephone Encounter (Signed)
Sent patient message in [place] and called left message for patient to call the office back.  MPulliam, CMA

## 2016-09-05 NOTE — Telephone Encounter (Signed)
Yes please let Analysia know we can Get an x-ray here since Melissa I believe now knows how to do them. Once reviewed, if needed we can send her to Podiatry

## 2016-09-09 ENCOUNTER — Ambulatory Visit: Payer: Commercial Managed Care - HMO

## 2016-09-09 ENCOUNTER — Telehealth: Payer: Self-pay | Admitting: Family Medicine

## 2016-09-09 ENCOUNTER — Ambulatory Visit (INDEPENDENT_AMBULATORY_CARE_PROVIDER_SITE_OTHER): Payer: Commercial Managed Care - HMO | Admitting: Family Medicine

## 2016-09-09 DIAGNOSIS — M79672 Pain in left foot: Secondary | ICD-10-CM

## 2016-09-09 DIAGNOSIS — M12872 Other specific arthropathies, not elsewhere classified, left ankle and foot: Secondary | ICD-10-CM | POA: Diagnosis not present

## 2016-09-09 DIAGNOSIS — M7732 Calcaneal spur, left foot: Secondary | ICD-10-CM

## 2016-09-09 NOTE — Telephone Encounter (Addendum)
Need dx code

## 2016-09-09 NOTE — Telephone Encounter (Signed)
Called patient and notified, she states that she would like to go ahead with podiatry order.  Thank you.  MPulliam, CMA

## 2016-09-09 NOTE — Telephone Encounter (Addendum)
Ok to refer to podiatry- thnx  I placed podiatry referral order.

## 2016-09-09 NOTE — Telephone Encounter (Signed)
X-rays of her foot appear N- no acute findings except for a boney heel spur which is a chronic finding-> that can be causing inc pain from plantar fascitis.     - Please let pt know if Radiology sees something acute on xray/ something different, we will certainly let her know.     - Is the pain bothersome enough that pt desires podiatry referral?  ( b/c that would be the next step in managing her condition at this point.  )

## 2016-09-10 NOTE — Progress Notes (Signed)
Patient notified of results. MPulliam, CMA/RT(R)  

## 2016-09-10 NOTE — Progress Notes (Signed)
Patient came in for x-ray of the left foot as advised and verbally ordered by Dr Sharee Holster. MPulliam, CMA/RT(R)

## 2016-09-25 ENCOUNTER — Encounter: Payer: Commercial Managed Care - HMO | Admitting: Family Medicine

## 2016-10-04 ENCOUNTER — Encounter: Payer: Self-pay | Admitting: Podiatry

## 2016-10-04 ENCOUNTER — Ambulatory Visit (INDEPENDENT_AMBULATORY_CARE_PROVIDER_SITE_OTHER): Payer: Commercial Managed Care - HMO | Admitting: Podiatry

## 2016-10-04 DIAGNOSIS — M779 Enthesopathy, unspecified: Secondary | ICD-10-CM

## 2016-10-04 DIAGNOSIS — M2140 Flat foot [pes planus] (acquired), unspecified foot: Secondary | ICD-10-CM

## 2016-10-04 DIAGNOSIS — M76829 Posterior tibial tendinitis, unspecified leg: Secondary | ICD-10-CM

## 2016-10-04 NOTE — Patient Instructions (Signed)
Posterior Tibial Tendon Tear Rehab Ask your health care provider which exercises are safe for you. Do exercises exactly as told by your health care provider and adjust them as directed. It is normal to feel mild stretching, pulling, tightness, or discomfort as you do these exercises, but you should stop right away if you feel sudden pain or your pain gets worse.Do not begin these exercises until told by your health care provider. Stretching and range of motion exercises These exercises warm up your muscles and joints and improve the movement and flexibility of your ankle. These exercises also help to relieve pain, numbness, and tingling. Exercise A: Gastroc and soleus stretch  1. Sit on the floor with your left / right leg extended. 2. Loop a belt or towel around ball of your left / right foot. The ball of your foot is on the walking surface, right under your toes. 3. Keep your left / right ankle and foot relaxed and keep your knee straight while you use the belt or towel to pull your foot and ankle toward you. You should feel a gentle stretch behind your calf or knee. 4. Hold this position for __________ seconds. Repeat __________ times. Complete this exercise __________ times a day. Exercise B: Dorsiflexion/plantar flexion  1. Sit with your left / right knee straight or bent. 2. Flex your left / right ankle to tilt the top of your foot toward your shin. 3. Hold this position for __________ seconds. 4. Point your toes downward to tilt the top of your foot away from your shin. 5. Hold this position for __________ seconds. Repeat __________ times with your knee straight and __________ times with your knee bent. Complete this exercise __________ times a day. Exercise C: Ankle plantar flexion, passive  1. Sit with your left / right leg crossed over your opposite knee. 2. Use your opposite hand to pull the top of your foot and toes toward you. You should feel a gentle stretch on the top of your  foot and ankle. 3. Hold this position for __________ seconds. Repeat __________ times. Complete this exercise __________ times a day. Exercise D: Ankle eversion  1. Sit with your left / right ankle crossed over your opposite knee. 2. Grip your left / right foot with your opposite hand, with your thumb on the top of your foot and with your fingers on the bottom of your foot. 3. Gently push your foot downward with a slight rotation so the smallest toes rise slightly toward the ceiling. You should feel a gentle stretch on the inside of your ankle. 4. Hold this stretch for __________ seconds. Repeat __________ times. Complete this exercise __________ times a day. Exercise E: Ankle inversion  1. Sit with your left / right ankle crossed over your opposite knee. 2. Hold your left / right foot with your opposite hand, with your thumb on the bottom of your foot and your fingers on the top of your foot. 3. Gently pull your foot. Your smallest toe should come toward you, and your thumb should be pushing against the ball of your foot. You should feel a gentle stretch on the outside of your ankle. 4. Hold the stretch for __________ seconds. Repeat __________ times. Complete this exercise __________ times a day. Exercise F: Ankle alphabet  1. Sit with your left / right leg supported at the lower leg. ? Do not rest your foot on anything. ? Make sure your foot has room to move freely. 2. Think of your   left / right foot as a paintbrush, and move your foot to trace each letter of the alphabet in the air. Keep your hip and knee still while you trace. 3. Trace every letter from A to Z. Repeat __________ times. Complete this exercise __________ times a day. Strengthening exercises These exercises build strength and endurance in your lower leg. Endurance is the ability to use your muscles for a long time, even after they get tired. Exercise G: Dorsiflexors  1. Secure a rubber exercise band or tube to an  object that will not move if it is pulled on, such as a table leg. 2. Secure the other end of the band around your left / right foot. 3. Sit on the floor, facing the object with your left / right leg extended. The band or tube should be slightly tense when your foot is relaxed. 4. Slowly flex your left / right ankle and toes to bring your foot toward you. 5. Hold this position for __________ seconds. 6. Let the band or tube slowly pull your foot back to the starting position. Repeat __________ times. Complete this exercise __________ times a day. Exercise H: Plantar flexors  1. Sit on the floor with your left / right leg extended. 2. Loop a rubber exercise band or tube around the ball of your __________ foot. The ball of your foot is on the walking surface, right under your toes. The band or tube should be slightly tense when your foot is relaxed. 3. Slowly point your toes downward, pushing them away from you. 4. Hold this position for __________ seconds. 5. Let the band or tube slowly pull your foot back to the starting position. Repeat __________ times. Complete this exercise __________ times a day. Exercise I: Towel curls  1. Sit in a chair on a non-carpeted surface, and put your feet on the floor. 2. Place a towel in front of your feet. If told by your health care provider, add __________ to the end of the towel. 3. Keeping your heel on the floor, put your left / right foot on the towel. 4. Pull the towel toward you by grabbing the towel with your toes and curling them under. Keep your heel on the floor. Repeat __________ times. Complete this exercise __________ times a day. This information is not intended to replace advice given to you by your health care provider. Make sure you discuss any questions you have with your health care provider. Document Released: 03/04/2005 Document Revised: 11/09/2015 Document Reviewed: 02/26/2015 Elsevier Interactive Patient Education  2018 [employer]  

## 2016-10-04 NOTE — Progress Notes (Signed)
   Subjective:    Patient ID: Mary Shaffer, female    DOB: [birth year], 51 y.o.   MRN: [id number]  HPI  Ms. Pirozzi Presents the office if concerns of left foot pain. She states the pain is in the RT foot and well as the medial aspect. She points the medial aspect where it seems to hurt more. She does that she Andrey Campanile she is walking. She describes a gradual onset been getting worse. She denies any specific injury or trauma she denies any redness or warmth. She said no recent treatment for this. Pain does not wake up at night. No other complaints today.  Review of Systems  All other systems reviewed and are negative.      Objective:   Physical Exam General: AAO x3, NAD  Dermatological: Skin is warm, dry and supple bilateral. Nails x 10 are well manicured; remaining integument appears unremarkable at this time. There are no open sores, no preulcerative lesions, no rash or signs of infection present.  Vascular: Dorsalis Pedis artery and Posterior Tibial artery pedal pulses are 2/4 bilateral with immedate capillary fill time. There is no pain with calf compression, swelling, warmth, erythema.   Neruologic: Grossly intact via light touch bilateral. Vibratory intact via tuning fork bilateral. Protective threshold with Semmes Wienstein monofilament intact to all pedal sites bilateral.   Musculoskeletal: There is a decrease in medial arch height upon weightbearing. HAV is present. There is tenderness on the greater tuberosity on the distal portion of the posterior tibial tendon. There is pain with single heel rise but she is able to do it. Able to do W arise. Equinus is present. Is no specific area of pinpoint tenderness identified at this time. Muscular strength 5/5 in all groups tested bilateral.  Gait: Unassisted, Nonantalgic.     Assessment & Plan:  51 year old female left posterior tibial tendinitis, PTTD -Treatment options discussed including all alternatives, risks, and  complications -Etiology of symptoms were discussed -X-rays were obtained and reviewed with the patient. No evidence of acute fracture identified. -At this point I do recommend custom inserts. She is more for orthotics today. -Discussed ankle brace -Rehabilitation exercises for posterior tibial tendon -Discussed shoe gear modifications -If symptoms continue discussed MRI.  Ovid Curd, DPM

## 2016-10-10 ENCOUNTER — Telehealth: Payer: Self-pay | Admitting: Podiatry

## 2016-10-10 NOTE — Telephone Encounter (Signed)
Can someone please take the charges out for this patient if they do not want the inserts.

## 2016-10-10 NOTE — Telephone Encounter (Signed)
Pt called and does not want the orthotics we  ordered her insurance will not cover them and she cannot afford them.

## 2016-10-10 NOTE — Telephone Encounter (Signed)
Mary Shaffer spoke to pt explaining the need for the orthotics and told pt he could give pt them for 275.00 and pt is working this out. We are holding off at the current time and pt will call to order them.

## 2016-10-24 ENCOUNTER — Ambulatory Visit: Payer: Commercial Managed Care - HMO | Admitting: Podiatry

## 2016-11-07 ENCOUNTER — Ambulatory Visit (INDEPENDENT_AMBULATORY_CARE_PROVIDER_SITE_OTHER): Payer: Self-pay | Admitting: Orthotics

## 2016-11-07 DIAGNOSIS — M76829 Posterior tibial tendinitis, unspecified leg: Secondary | ICD-10-CM

## 2016-11-07 DIAGNOSIS — M2140 Flat foot [pes planus] (acquired), unspecified foot: Secondary | ICD-10-CM

## 2016-11-07 DIAGNOSIS — M79673 Pain in unspecified foot: Secondary | ICD-10-CM

## 2016-11-07 DIAGNOSIS — M779 Enthesopathy, unspecified: Secondary | ICD-10-CM

## 2016-11-12 NOTE — Progress Notes (Signed)
Patient came in today to pick up custom made foot orthotics.  The goals were accomplished and the patient reported no dissatisfaction with said orthotics.  Patient was advised of breakin period and how to report any issues.  Patient advised of cost $275.00 (this was before price increased to $300.

## 2017-06-12 ENCOUNTER — Ambulatory Visit: Payer: 59 | Admitting: Family Medicine

## 2017-06-12 ENCOUNTER — Encounter: Payer: Self-pay | Admitting: Family Medicine

## 2017-06-12 VITALS — BP 118/82 | HR 69 | Ht 65.0 in | Wt 205.0 lb

## 2017-06-12 DIAGNOSIS — Z7189 Other specified counseling: Secondary | ICD-10-CM | POA: Diagnosis not present

## 2017-06-12 DIAGNOSIS — F4323 Adjustment disorder with mixed anxiety and depressed mood: Secondary | ICD-10-CM | POA: Diagnosis not present

## 2017-06-12 DIAGNOSIS — E669 Obesity, unspecified: Secondary | ICD-10-CM | POA: Diagnosis not present

## 2017-06-12 DIAGNOSIS — R7303 Prediabetes: Secondary | ICD-10-CM | POA: Diagnosis not present

## 2017-06-12 DIAGNOSIS — G4700 Insomnia, unspecified: Secondary | ICD-10-CM

## 2017-06-12 MED ORDER — PHENTERMINE HCL 37.5 MG PO TABS: 37.5000 mg | ORAL_TABLET | Freq: Every day | ORAL | 0 refills | Status: DC

## 2017-06-12 NOTE — Patient Instructions (Signed)
Please use the lose it app or my fitness pal and track all of the food that you put into your body.  Also any caloric beverages as well.    Goals not to lose weight in the next 30 days but just to start tracking.  I recommend you do not start the weight loss med until you get the diet down. It is best to follow-up every 4 weeks minimum or sooner for maximum benefits and to be the most successful with your weight loss goals.

## 2017-06-12 NOTE — Progress Notes (Signed)
Wt loss OV note   Impression and Recommendations:    1. Obesity (BMI 30-39.9)   2. Prediabetes   3. 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 Counseling on health promotion and disease prevention   5. Insomnia, unspecified type     - Weight Mgt: Explained to patient what BMI refers to, and what it means medically.    Told patient to think about it as a "medical risk stratification measurement" and how increasing BMI is associated with increasing risk/ or worsening state of various diseases such as hypertension, hyperlipidemia, diabetes, premature OA, depression etc.  American Heart Association guidelines for healthy diet, basically Mediterranean diet, and exercise guidelines of 30 minutes 5 days per week or more discussed in detail.  -Reminded patient the need for yearly complete physical exam office visits in addition to office visits for management of the chronic diseases  1. Obesity 2. Prediabetes Pt will come in in near future for blood work at her next physical. Understands how weight contributes to this.  3. Adjustment disorder with mixed emotions/depressed mood -she has amitriptyline to use prn which works well without side effect. 4. Counseling on health promotion and disease prevention 5. Insomnia -she has amitriptyline to use prn which works well without side effect.   -Start phentermine. Pt has used this before and tolerated well. Do not use this until you are able to follow your diet religiously.   -Strongly recommended to track your daily food intake using the Lose It app.  -Explained in detail various diets. Discussed the keto diet in detail, explained to pt she needs to eat high fat and very low to zero carb.  -Eat frequent meals throughout the day (every 2 hours).  -Told pt we will prescribe phentermine, but it is very important for her to be very strict with her diet and and Lose It tracking app.  -Goal: use Lose It or My Fitness Pal  tracking apps.    Education and routine counseling performed. Handouts provided.  Meds ordered this encounter  Medications  . phentermine (ADIPEX-P) 37.5 MG tablet    Sig: Take 1 tablet (37.5 mg total) by mouth daily before breakfast.    Dispense:  90 tablet    Refill:  0    No orders of the defined types were placed in this encounter.   The patient was counseled, risk factors were discussed, anticipatory guidance given.  -Gross side effects, risk and benefits, and alternatives of medications discussed with patient.  Patient is aware that all medications have potential side effects and we are unable to predict every side effect or drug-drug interaction that may occur.  Expresses verbal understanding and consents to current therapy plan and treatment regimen.   Return for CPE/ yrly physical, come fasting- needs blood work near future.   Please see AVS handed out to patient at the end of our visit for further patient instructions/ counseling done pertaining to today's office visit.    Note: This document was prepared using Dragon voice recognition software and may include unintentional dictation errors.    This document serves as a record of services personally performed by Thomasene Lot, DO. It was created on her behalf by Thelma Barge, a trained medical scribe. The creation of this record is based on the scribe's personal observations and the provider's statements to them.   I have reviewed the above medical documentation for accuracy and completeness and I concur.  Thomasene Lot 06/24/17 6:28 PM  ______________________________________________________________________  Subjective:  HPI: Mary D Brady65 y.o. female  presents for 3 month follow up for multiple medical problems. Pt is requesting information on how to live a healthier lifestyle and to lose weight.   Weight She has gained a lot of weight recently, (20 pounds since last OV 06-12-17. She typically does not  eat breakfast foods and snacks on junk food regularly. She states she could eat every hour, if allowed to, but when she does, it is always jelly beans or cheetos. She has used phentermine before in the past which worked on reducing her appetite. She has used weight watchers before. She states her son has used a keto modified diet. She drinks diet dr. Reino Kent, which reduced her intake of mountain dew.   Weight:  Wt Readings from Last 3 Encounters:  06/12/17 205 lb (93 kg)  08/27/16 184 lb (83.5 kg)  07/30/16 181 lb 6.4 oz (82.3 kg)   BMI Readings from Last 3 Encounters:  06/12/17 34.11 kg/m  08/27/16 30.39 kg/m  07/30/16 29.96 kg/m   Lab Results  Component Value Date   HGBA1C 5.7 03/14/2016   HGBA1C 5.6 09/05/2015    Review of Systems: General:   No F/C, wt loss Pulm:   No DIB, SOB, pleuritic chest pain Card:  No CP, palpitations Abd:  No n/v/d or pain Ext:  No inc edema from baseline   Objective: Physical Exam: BP 118/82   Pulse 69   Ht 5\' 5"  (1.651 m)   Wt 205 lb (93 kg)   SpO2 100%   BMI 34.11 kg/m  Body mass index is 34.11 kg/m. General: Well nourished, in no apparent distress. Eyes: PERRLA, EOMs, conjunctiva clr no swelling or erythema ENT/Mouth: Hearing appears normal.  Mucus Membranes Moist  Neck: Supple, no masses Resp: Respiratory effort- normal, ECTA B/L w/o W/R/R  Cardio: RRR w/o MRGs. Abdomen: no gross distention. Lymphatics:  Brisk peripheral pulses, less 2 sec cap RF, no gross edema  M-sk: Full ROM, 5/5 strength, normal gait.  Skin: Warm, dry without rashes, lesions, ecchymosis.  Neuro: Alert, Oriented Psych: Normal affect, Insight and Judgment appropriate.    Current Medications:  Current Outpatient Medications on File Prior to Visit  Medication Sig Dispense Refill  . amitriptyline (ELAVIL) 25 MG tablet Take 2 tablets (50 mg total) by mouth at bedtime. 180 tablet 0  . Cholecalciferol (VITAMIN D3) 5000 units TABS Take 1 tablet by mouth daily.      . diclofenac sodium (VOLTAREN) 1 % GEL Apply 2 g topically 4 (four) times daily. To affected joint. 100 g 1  . ibuprofen (ADVIL,MOTRIN) 200 MG tablet Take 400 mg by mouth every 6 (six) hours as needed.    . Vitamin D, Ergocalciferol, (DRISDOL) 50000 units CAPS capsule Take 1 capsule (50,000 Units total) by mouth every 7 (seven) days. 12 capsule 2  . Naltrexone-Bupropion HCl ER 8-90 MG TB12 1 tab daily for week 1, then 1 tab BID for week 2, then 2 tab PO qAM and 1 tab PO qPM for week 3, then 2 tabs BID. 80 tablet 0   No current facility-administered medications on file prior to visit.     Medical History:  Patient Active Problem List   Diagnosis Date Noted  . RLS (restless legs syndrome) 08/27/2016    Priority: High  . Prediabetes\ glucose intolerance 04/10/2016    Priority: High  . 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 Overweight (BMI 25.0-29.9)  08/17/2015    Priority: High  . Rheumatoid arthritis (HCC) 08/17/2015    Priority: High  . History of hysterectomy - benign reasons 10/01/2015    Priority: Medium  . Chronic pain syndrome 09/26/2015    Priority: Medium  . Muscle pain, myofacial 01/08/2007    Priority: Medium  . Vitamin D deficiency 10/01/2015    Priority: Low  . Continuous tobacco abuse 10/01/2015    Priority: Low  . Tobacco abuse 08/17/2015    Priority: Low  . Tobacco abuse counseling 08/17/2015    Priority: Low  . Obesity (BMI 30-39.9) 06/12/2017  . Counseling on health promotion and disease prevention 07/30/2016  . Dysesthesia affecting both sides of body 04/10/2016  . Encounter for weight loss counseling 03/30/2016  . Arthritis 10/01/2015  . CONTACT DERMATITIS&OTHER ECZEMA DUE UNSPEC CAUSE 03/24/2009    Allergies:  Allergies  Allergen Reactions  . Codeine Sulfate     REACTION: Rash and nausea  . Sulfa Antibiotics Itching    And hives     Family history-  Reviewed; changed as appropriate  Social  history-  Reviewed; changed as appropriate

## 2017-06-13 ENCOUNTER — Encounter: Payer: Self-pay | Admitting: Family Medicine

## 2017-07-01 ENCOUNTER — Ambulatory Visit: Payer: 59 | Admitting: Family Medicine

## 2017-07-01 ENCOUNTER — Encounter: Payer: Self-pay | Admitting: Family Medicine

## 2017-07-01 VITALS — BP 114/81 | HR 80 | Temp 99.3°F | Ht 64.0 in | Wt 199.7 lb

## 2017-07-01 DIAGNOSIS — J019 Acute sinusitis, unspecified: Secondary | ICD-10-CM | POA: Diagnosis not present

## 2017-07-01 DIAGNOSIS — B9689 Other specified bacterial agents as the cause of diseases classified elsewhere: Secondary | ICD-10-CM | POA: Diagnosis not present

## 2017-07-01 DIAGNOSIS — R05 Cough: Secondary | ICD-10-CM

## 2017-07-01 DIAGNOSIS — R059 Cough, unspecified: Secondary | ICD-10-CM

## 2017-07-01 DIAGNOSIS — J40 Bronchitis, not specified as acute or chronic: Secondary | ICD-10-CM | POA: Diagnosis not present

## 2017-07-01 MED ORDER — BENZONATATE 100 MG PO CAPS: ORAL_CAPSULE | ORAL | 0 refills | Status: DC

## 2017-07-01 MED ORDER — ALBUTEROL SULFATE HFA 108 (90 BASE) MCG/ACT IN AERS: 1.0000 | INHALATION_SPRAY | RESPIRATORY_TRACT | 0 refills | Status: DC | PRN

## 2017-07-01 MED ORDER — PREDNISONE 20 MG PO TABS: ORAL_TABLET | ORAL | 0 refills | Status: DC

## 2017-07-01 MED ORDER — LEVOFLOXACIN 750 MG PO TABS: 750.0000 mg | ORAL_TABLET | Freq: Every day | ORAL | 0 refills | Status: DC

## 2017-07-01 NOTE — Patient Instructions (Signed)
Mary Shaffer no work tomorrow due to fevers and illness.  May return if fever free for 24 hours and improved symptoms    Acute Bronchitis, Adult Acute bronchitis is sudden (acute) swelling of the air tubes (bronchi) in the lungs. Acute bronchitis causes these tubes to fill with mucus, which can make it hard to breathe. It can also cause coughing or wheezing. In adults, acute bronchitis usually goes away within 2 weeks. A cough caused by bronchitis may last up to 3 weeks. Smoking, allergies, and asthma can make the condition worse. Repeated episodes of bronchitis may cause further lung problems, such as chronic obstructive pulmonary disease (COPD). What are the causes? This condition can be caused by germs and by substances that irritate the lungs, including:  Cold and flu viruses. This condition is most often caused by the same virus that causes a cold.  Bacteria.  Exposure to tobacco smoke, dust, fumes, and air pollution.  What increases the risk? This condition is more likely to develop in people who:  Have close contact with someone with acute bronchitis.  Are exposed to lung irritants, such as tobacco smoke, dust, fumes, and vapors.  Have a weak immune system.  Have a respiratory condition such as asthma.  What are the signs or symptoms? Symptoms of this condition include:  A cough.  Coughing up clear, yellow, or green mucus.  Wheezing.  Chest congestion.  Shortness of breath.  A fever.  Body aches.  Chills.  A sore throat.  How is this diagnosed? This condition is usually diagnosed with a physical exam. During the exam, your health care provider may order tests, such as chest X-rays, to rule out other conditions. He or she may also:  Test a sample of your mucus for bacterial infection.  Check the level of oxygen in your blood. This is done to check for pneumonia.  Do a chest X-ray or lung function testing to rule out pneumonia and other conditions.  Perform  blood tests.  Your health care provider will also ask about your symptoms and medical history. How is this treated? Most cases of acute bronchitis clear up over time without treatment. Your health care provider may recommend:  Drinking more fluids. Drinking more makes your mucus thinner, which may make it easier to breathe.  Taking a medicine for a fever or cough.  Taking an antibiotic medicine.  Using an inhaler to help improve shortness of breath and to control a cough.  Using a cool mist vaporizer or humidifier to make it easier to breathe.  Follow these instructions at home: Medicines  Take over-the-counter and prescription medicines only as told by your health care provider.  If you were prescribed an antibiotic, take it as told by your health care provider. Do not stop taking the antibiotic even if you start to feel better. General instructions  Get plenty of rest.  Drink enough fluids to keep your urine clear or pale yellow.  Avoid smoking and secondhand smoke. Exposure to cigarette smoke or irritating chemicals will make bronchitis worse. If you smoke and you need help quitting, ask your health care provider. Quitting smoking will help your lungs heal faster.  Use an inhaler, cool mist vaporizer, or humidifier as told by your health care provider.  Keep all follow-up visits as told by your health care provider. This is important. How is this prevented? To lower your risk of getting this condition again:  Wash your hands often with soap and water. If soap and water  are not available, use hand sanitizer.  Avoid contact with people who have cold symptoms.  Try not to touch your hands to your mouth, nose, or eyes.  Make sure to get the flu shot every year.  Contact a health care provider if:  Your symptoms do not improve in 2 weeks of treatment. Get help right away if:  You cough up blood.  You have chest pain.  You have severe shortness of breath.  You  become dehydrated.  You faint or keep feeling like you are going to faint.  You keep vomiting.  You have a severe headache.  Your fever or chills gets worse. This information is not intended to replace advice given to you by your health care provider. Make sure you discuss any questions you have with your health care provider. Document Released: 04/11/2004 Document Revised: 09/27/2015 Document Reviewed: 08/23/2015 Elsevier Interactive Patient Education  [employer].

## 2017-07-01 NOTE — Progress Notes (Signed)
Acute Care Office visit  Assessment and plan:  1. Cough   2. Acute bacterial Bronchitis   3. Acute bacterial rhinosinusitis    1. Cough 2. Acute bacterial bronchitis 3. Acute bacterial rhinosinusitis  -Start abx. -Start prednisone taper. Shot of prednisone given today.  -Start albuterol when you feel SOB. -Start Industrial/product designer. -Take OTC medications for symptom relief. -Get plenty of rest. Push fluids. Supportive care discussed.  -Pt declines removal of cerumen impaction. -Use half hydrogen peroxide and half rubbing alcohol and put this in your ear.   - Viral vs Allergic vs Bacterial causes for pt's symptoms reveiwed.    - Supportive care and various OTC medications discussed in addition to any prescribed. - Call or RTC if new symptoms, or if no improvement or worse over next several days.     Meds ordered this encounter  Medications  . levofloxacin (LEVAQUIN) 750 MG tablet    Sig: Take 1 tablet (750 mg total) by mouth daily.    Dispense:  10 tablet    Refill:  0  . predniSONE (DELTASONE) 20 MG tablet    Sig: Take 3 tabs po * 2 days, then 2 tabs for 2 d, then 1 tab 2 d, then 1/2 tab 2 days.    Dispense:  15 tablet    Refill:  0  . albuterol (PROVENTIL HFA;VENTOLIN HFA) 108 (90 Base) MCG/ACT inhaler    Sig: Inhale 1-2 puffs into the lungs every 4 (four) hours as needed for wheezing or shortness of breath.    Dispense:  1 Inhaler    Refill:  0  . benzonatate (TESSALON) 100 MG capsule    Sig: 1-2 capsules every 8 hours as needed cough    Dispense:  60 capsule    Refill:  0    No orders of the defined types were placed in this encounter.   Gross side effects, risk and benefits, and alternatives of medications discussed with patient.  Patient is aware that all medications have potential side effects and we are unable to predict every sideeffect or drug-drug interaction that may occur.  Expresses verbal understanding and consents to current therapy plan and treatment  regiment.   Education and routine counseling performed. Handouts provided.  Anticipatory guidance and routine counseling done re: condition, txmnt options and need for follow up. All questions of patient's were answered.  Return if symptoms worsen or fail to improve.  Please see AVS handed out to patient at the end of our visit for additional patient instructions/ counseling done pertaining to today's office visit.  Note: This document was partially repared using Dragon voice recognition software and may include unintentional dictation errors.   This document serves as a record of services personally performed by Thomasene Lot, DO. It was created on her behalf by Thelma Barge, a trained medical scribe. The creation of this record is based on the scribe's personal observations and the provider's statements to them.   I have reviewed the above medical documentation for accuracy and completeness and I concur.  Thomasene Lot 07/01/17 5:35 PM   Subjective:    Chief Complaint  Patient presents with  . Cough    productive cough, fatigue, congestion    HPI:  Pt presents with Sx for 14 days   C/o: productive cough for 2 weeks, since yesterday at 4pm she had aches and chest tightness, chills, fever, fatigue. She states initially she thought she had environmental allergies.   Denies: sore throat (she states  it feels raw but does not hurt), SOB (unless she coughs really bad), and ear ache. She further denies one-sided facial pain. She denies pain when she breathes.     For symptoms patient has tried:  Allergy medicine, tylenol cold/sinus from a recent sinus infection, ibuprofen.  Overall getting:   Worse    Patient Care Team    Relationship Specialty Notifications Start End  Thomasene Lot, DO PCP - General Family Medicine  08/17/15   Richardean Chimera, MD Consulting Physician Obstetrics and Gynecology  06/13/17     Past medical history, Surgical history, Family history reviewed  and noted below, Social history, Allergies, and Medications have been entered into the medical record, reviewed and changed as needed.   Allergies  Allergen Reactions  . Codeine Sulfate     REACTION: Rash and nausea  . Sulfa Antibiotics Itching    And hives    Review of Systems: - see above HPI for pertinent positives General:   No wt loss Pulm:   No DIB, pleuritic chest pain Card:  No CP, palpitations Abd:  No n/v/d or pain Ext:  No inc edema from baseline   Objective:   Blood pressure 114/81, pulse 80, temperature 99.3 F (37.4 C), height 5\' 4"  (1.626 m), weight 199 lb 11.2 oz (90.6 kg), SpO2 96 %. Body mass index is 34.28 kg/m. General: Well Developed, well nourished, appropriate for stated age.  Neuro: Alert and oriented x3, extra-ocular muscles intact, sensation grossly intact.  HEENT: Normocephalic, atraumatic, pupils equal round reactive to light, neck supple, no masses, no painful lymphadenopathy. Nares- patent, clear d/c, OP- clear, mild erythema, No TTP sinuses TM's unable to visualize on the R. L has moderate cerumen impaction.  Skin: Warm and dry, no gross rash. Cardiac: RRR, S1 S2,  no murmurs rubs or gallops.  Respiratory: ECTA B/L and A/P with wh that cleared with deep  breaths, Not using accessory muscles, speaking in full sentences- unlabored.  Vascular:  No gross lower ext edema, cap RF less 2 sec. Psych: No HI/SI, judgement and insight good, Euthymic mood. Full Affect.

## 2017-07-31 ENCOUNTER — Ambulatory Visit: Payer: 59 | Admitting: Family Medicine

## 2017-09-21 IMAGING — DX DG FOOT COMPLETE 3+V*L*
3 series · 3 of 3 positions shown · non-contrast
Comparison: 07/12/2011.

CLINICAL DATA: Arthritis.  Foot pain .

EXAM:
LEFT FOOT - COMPLETE 3+ VIEW

[foot dp]
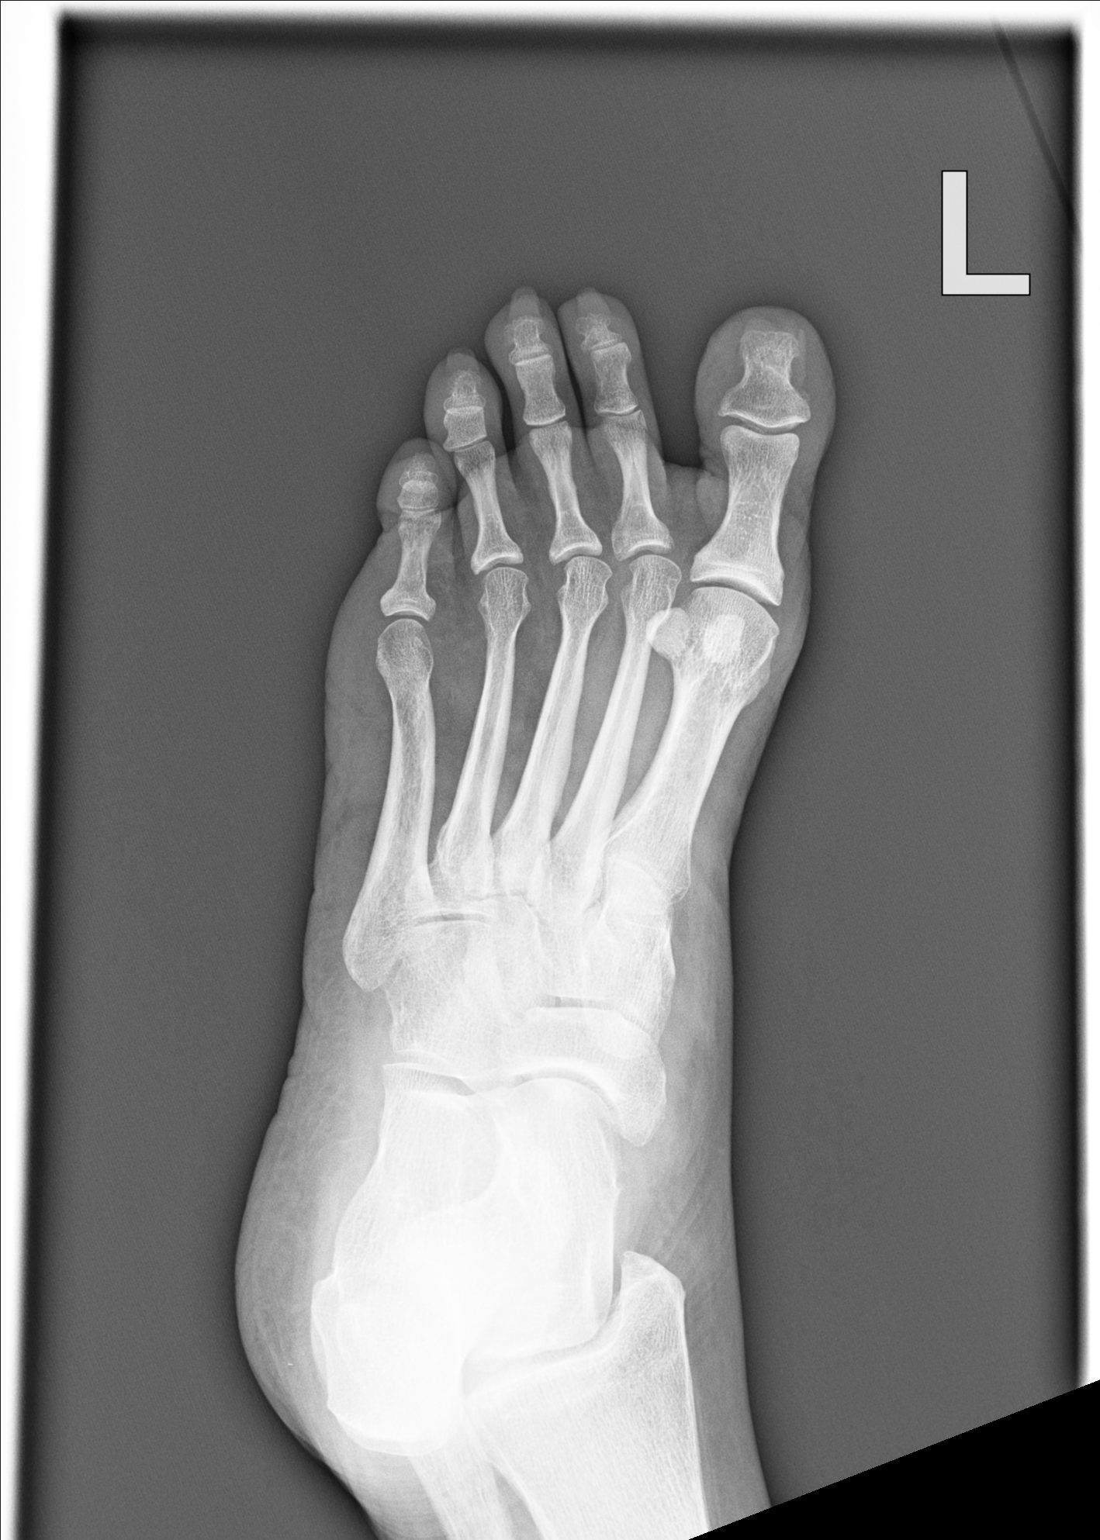

[foot oblique]
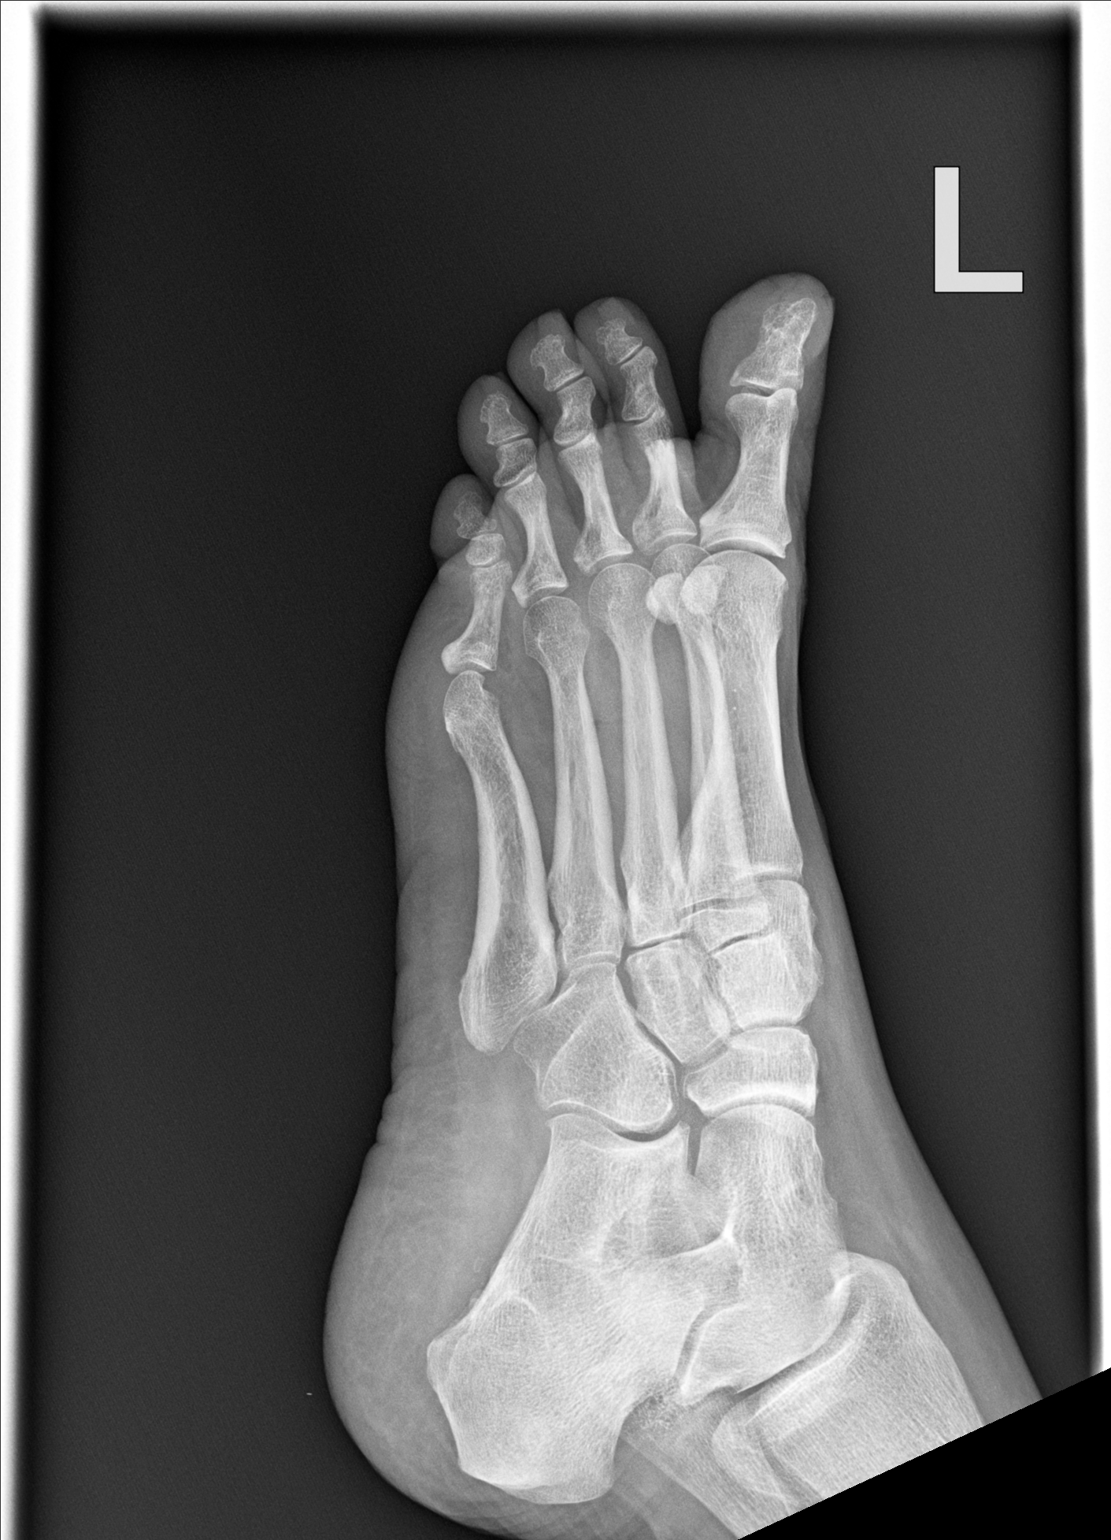

[foot lat]
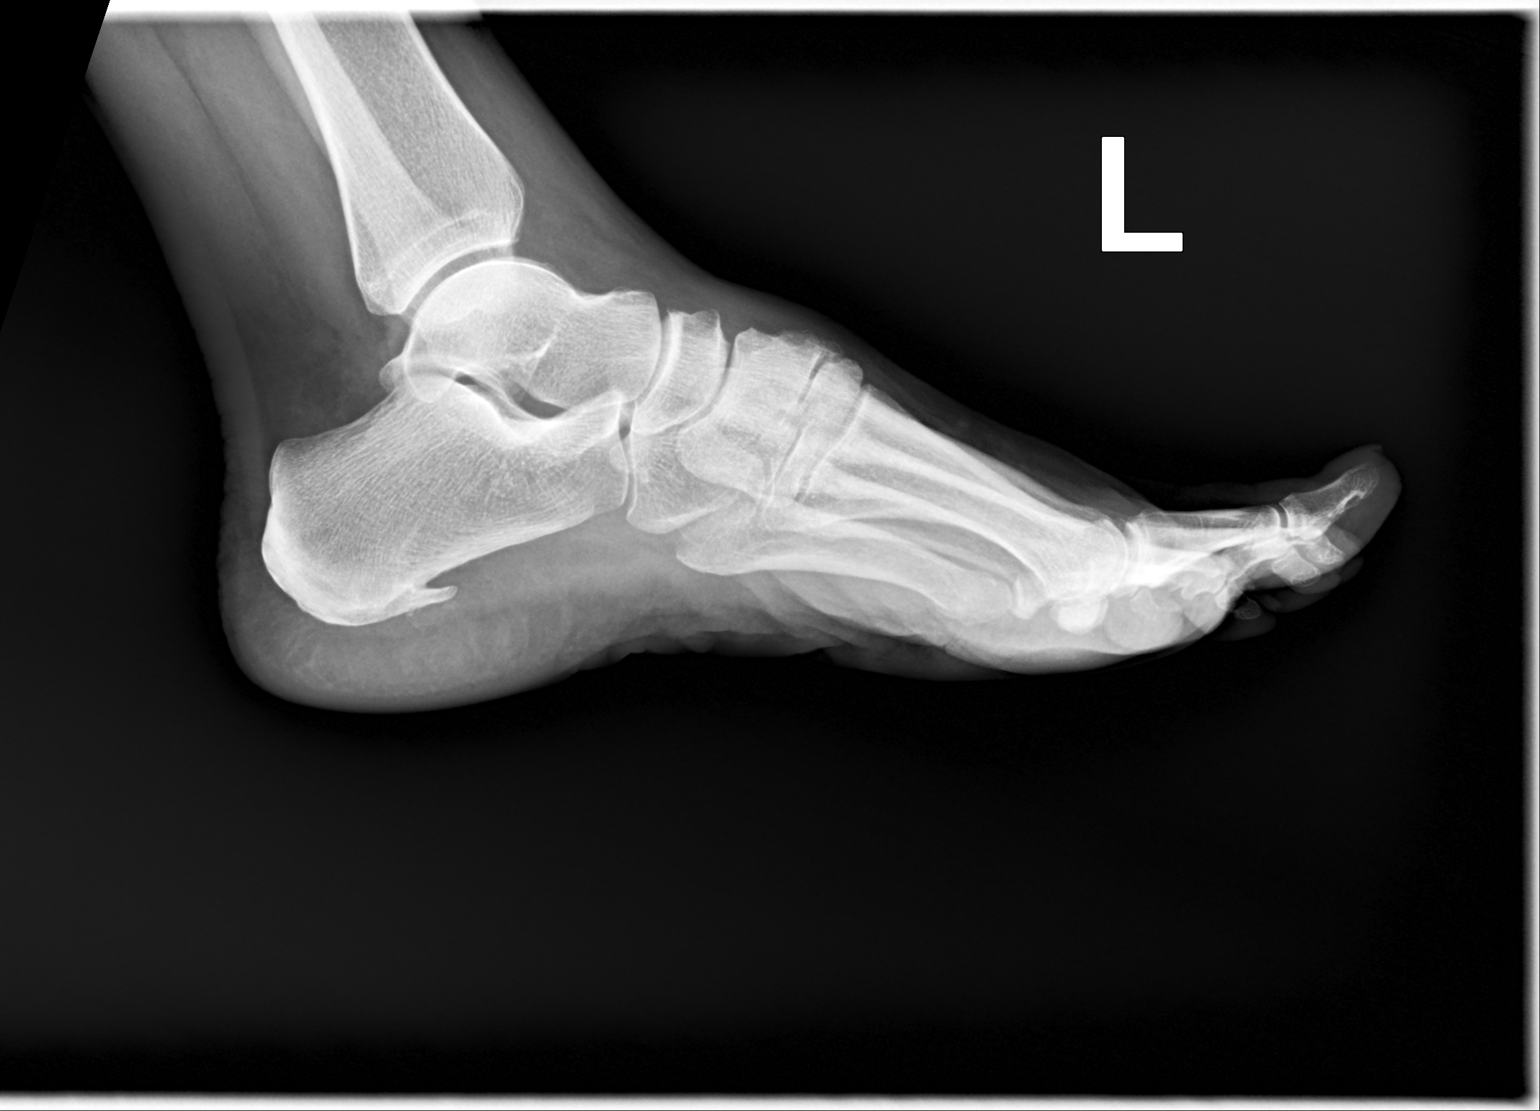

[3 of 3 positions shown; findings below may reference images not displayed]

FINDINGS: Mild diffuse degenerative change. No evidence of erosive
arthropathy. No evidence of fracture or dislocation.
IMPRESSION: Diffuse or wall mild degenerative change. No evidence of erosive
arthropathy. No acute abnormality.

## 2018-01-06 DIAGNOSIS — Z6833 Body mass index (BMI) 33.0-33.9, adult: Secondary | ICD-10-CM | POA: Diagnosis not present

## 2018-01-06 DIAGNOSIS — Z01419 Encounter for gynecological examination (general) (routine) without abnormal findings: Secondary | ICD-10-CM | POA: Diagnosis not present

## 2018-03-04 ENCOUNTER — Encounter: Payer: Self-pay | Admitting: Family Medicine

## 2018-03-04 ENCOUNTER — Ambulatory Visit: Payer: 59 | Admitting: Family Medicine

## 2018-03-04 VITALS — BP 111/77 | HR 73 | Temp 98.0°F | Ht 64.0 in | Wt 199.2 lb

## 2018-03-04 DIAGNOSIS — E559 Vitamin D deficiency, unspecified: Secondary | ICD-10-CM

## 2018-03-04 DIAGNOSIS — R208 Other disturbances of skin sensation: Secondary | ICD-10-CM | POA: Diagnosis not present

## 2018-03-04 DIAGNOSIS — Z791 Long term (current) use of non-steroidal anti-inflammatories (NSAID): Secondary | ICD-10-CM

## 2018-03-04 DIAGNOSIS — M069 Rheumatoid arthritis, unspecified: Secondary | ICD-10-CM | POA: Diagnosis not present

## 2018-03-04 DIAGNOSIS — G894 Chronic pain syndrome: Secondary | ICD-10-CM | POA: Diagnosis not present

## 2018-03-04 DIAGNOSIS — E669 Obesity, unspecified: Secondary | ICD-10-CM

## 2018-03-04 DIAGNOSIS — R7303 Prediabetes: Secondary | ICD-10-CM

## 2018-03-04 DIAGNOSIS — Z5181 Encounter for therapeutic drug level monitoring: Secondary | ICD-10-CM

## 2018-03-04 MED ORDER — MELOXICAM 7.5 MG PO TABS: ORAL_TABLET | ORAL | 0 refills | Status: AC

## 2018-03-04 MED ORDER — OMEPRAZOLE 20 MG PO CPDR: DELAYED_RELEASE_CAPSULE | ORAL | 1 refills | Status: AC

## 2018-03-04 MED ORDER — PREDNISONE 20 MG PO TABS: ORAL_TABLET | ORAL | 0 refills | Status: DC

## 2018-03-04 NOTE — Patient Instructions (Signed)
Please let me know if you would like referral to a new rheumatologist.  Otherwise I will see you in 2 months with fasting blood work 1 week prior to office visit with me.    Rheumatoid Arthritis Rheumatoid arthritis (RA) is a long-term (chronic) disease that causes inflammation in your joints. RA may start slowly. It usually affects the small joints of the hands and feet. Usually, the same joints are affected on both sides of your body. Inflammation from RA can also affect other parts of your body, including your heart, eyes, or lungs. RA is an autoimmune disease. That means that your body's defense system (immune system) mistakenly attacks healthy body tissues. There is no cure for RA, but medicines can help your symptoms and halt or slow down the progression of the disease. What are the causes? The exact cause of RA is not known. What increases the risk? This condition is more likely to develop in:  Women.  People who have a family history of RA or other autoimmune diseases. What are the signs or symptoms? Symptoms of this condition vary from person to person. Symptoms usually start gradually. They are often worse in the morning. The first symptom may be morning stiffness that lasts longer than 30 minutes. As RA progresses, symptoms may include:  Pain, stiffness, swelling, warmth, and tenderness in joints on both sides of your body.  Loss of energy.  Loss of appetite.  Weight loss.  Low-grade fever.  Dry eyes and dry mouth.  Firm lumps (rheumatoid nodules) that grow beneath your skin in areas such as your forearm bones near your elbows and on your hands.  Changes in the appearance of joints (deformity) and loss of joint function. Symptoms of RA often come and go. Sometimes, symptoms get worse for a period of time. These are called flares. How is this diagnosed? This condition is diagnosed based on your symptoms, medical history, and physical exam. You may have X-rays or MRI  to check for the type of joint changes that are caused by RA. You may also have blood tests to look for:  Proteins (antibodies) that your immune system may make if you have RA. They include rheumatoid factor (RF) and anti-CCP. ? When blood tests show these proteins, you are said to have "seropositive RA." ? When blood tests do not show these proteins, you may have "seronegative RA."  Inflammation in your blood.  A low number of red blood cells (anemia). How is this treated? The goals of treatment are to relieve pain, reduce inflammation, and slow down or stop joint damage and disability. Treatment may include:  Lifestyle changes. It is important to rest, eat a healthy diet, and exercise.  Medicines. Your health care provider may adjust your medicines every 3 months until treatment goals are reached. Common medicines include: ? Pain relievers (analgesics). ? Corticosteroids and NSAIDs to reduce inflammation. ? Disease-modifying antirheumatic drugs (DMARDs) to try to slow the course of the disease. ? Biologic response modifiers to reduce inflammation and damage.  Physical therapy and occupational therapy.  Surgery, if you have severe joint damage. Joint replacement or fusing of joints may be needed. Your health care provider will work with you to identify the best treatment option for you based on assessment of the overall disease activity in your body. Follow these instructions at home:  Take over-the-counter and prescription medicines only as told by your health care provider.  Start an exercise program as told by your health care provider.  Rest  when you are having a flare.  Return to your normal activities as told by your health care provider. Ask your health care provider what activities are safe for you.  Keep all follow-up visits as told by your health care provider. This is important. Where to find more information  [employer]:  www.rheumatology.org  Arthritis Foundation: www.arthritis.org Contact a health care provider if:  You have a flare-up of RA symptoms.  You have a fever.  You have side effects from your medicines. Get help right away if:  You have chest pain.  You have trouble breathing.  You quickly develop a hot, painful joint that is more severe than your usual joint aches. This information is not intended to replace advice given to you by your health care provider. Make sure you discuss any questions you have with your health care provider. Document Released: 03/01/2000 Document Revised: 08/15/2016 Document Reviewed: 12/15/2014 Elsevier Interactive Patient Education  2019 [employer].

## 2018-03-04 NOTE — Progress Notes (Signed)
Impression and Recommendations:    1. Dysesthesia affecting both sides of body   2. Rheumatoid arthritis involving multiple sites, unspecified rheumatoid factor presence (HCC)   3. Chronic pain syndrome   4. Prediabetes\ glucose intolerance   5. Vitamin D deficiency   6. Obesity (BMI 30-39.9)   7. Encounter for monitoring chronic NSAID therapy     1. Rheumatoid Arthritis Involving Multiple Sites - Recent Flare Ups - Advised patient to follow up with Rheumatology.  - Blood work drawn prior to follow-up in 2 months. - Full set of fasting lab work will be drawn in near future, including rheumatological assessment.  - Patient wishes to hold off on referral to rheumatology and follow up here in a couple of months.  Patient agrees that if her symptoms are doing worse, she will obtain a referral to rheumatology at that time.  - Reviewed that NSAID's are meant as temporary, short-acting pain medications.  Discussed that over a long period of time, these NSAID's can cause irritation of the stomach and esophagus, and potentially lead to gastric and GI cancers.  - Educated patient at length about prudent care and management of RA.  - Discussed that any treatment provided today would be short-term, until patient resumes chronic management with a rheumatologist.  - Short-acting steroid taper prescribed.  Reviewed risks and benefits of short-term use of steroids.  - Mobic/meloxicam prescribed today.  Reviewed risks and benefits of short-term use of meloxicam.  - Omeprazole provided for use along with management on Mobic.  - STRONGLY recommended that patient seriously look into strict diets for management of RA.  2. BMI Counseling - BMI of 34.19, Obesity Explained to patient what BMI refers to, and what it means medically.    Told patient to think about it as a "medical risk stratification measurement" and how increasing BMI is associated with increasing risk/ or worsening state of  various diseases such as hypertension, hyperlipidemia, diabetes, premature OA, depression etc.  American Heart Association guidelines for healthy diet, basically Mediterranean diet, and exercise guidelines of 30 minutes 5 days per week or more discussed in detail.  Health counseling performed.  All questions answered.  3. Lifestyle & Preventative Health Maintenance - Advised patient to continue working toward exercising to improve overall mental, physical, and emotional health.    - Reviewed the "spokes of the wheel" of mood and health management.  Stressed the importance of ongoing prudent habits, including regular exercise, appropriate sleep hygiene, healthful dietary habits, and prayer/meditation to relax.  - Encouraged patient to engage in daily physical activity, especially a formal exercise routine.  Recommended that the patient eventually strive for at least 150 minutes of moderate cardiovascular activity per week according to guidelines established by the [facility].   - Healthy dietary habits encouraged, including low-carb, and high amounts of lean protein in diet.   - Patient should also consume adequate amounts of water.    Orders Placed This Encounter  Procedures  . B12 and Folate Panel  . VITAMIN D 25 Hydroxy (Vit-D Deficiency, Fractures)  . TSH  . T4, free  . Phosphorus  . Magnesium  . Lipid panel  . Hemoglobin A1c  . Comprehensive metabolic panel  . CBC with Differential/Platelet  . Uric acid    Meds ordered this encounter  Medications  . predniSONE (DELTASONE) 20 MG tablet    Sig: Take 3 pills a day for 2 days, 2 pills a day for 2 days, 1 pill a day  for 2 days then one half pill a day for 2 days then off    Dispense:  14 tablet    Refill:  0  . meloxicam (MOBIC) 7.5 MG tablet    Sig: 1-2 tab po qd PRN pain    Dispense:  180 tablet    Refill:  0  . omeprazole (PRILOSEC) 20 MG capsule    Sig: 1 cap daily along with mobic/ nsaid    Dispense:  90 capsule    Refill:   1    Medications Discontinued During This Encounter  Medication Reason  . albuterol (PROVENTIL HFA;VENTOLIN HFA) 108 (90 Base) MCG/ACT inhaler No longer needed (for PRN medications)  . amitriptyline (ELAVIL) 25 MG tablet   . benzonatate (TESSALON) 100 MG capsule Completed Course  . diclofenac sodium (VOLTAREN) 1 % GEL No longer needed (for PRN medications)  . levofloxacin (LEVAQUIN) 750 MG tablet Completed Course  . Naltrexone-Bupropion HCl ER 8-90 MG TB12 Completed Course  . phentermine (ADIPEX-P) 37.5 MG tablet   . predniSONE (DELTASONE) 20 MG tablet Completed Course  . Vitamin D, Ergocalciferol, (DRISDOL) 50000 units CAPS capsule      Gross side effects, risk and benefits, and alternatives of medications and treatment plan in general discussed with patient.  Patient is aware that all medications have potential side effects and we are unable to predict every side effect or drug-drug interaction that may occur.   Patient will call with any questions prior to using medication if they have concerns.    Expresses verbal understanding and consents to current therapy and treatment regimen.  No barriers to understanding were identified.  Red flag symptoms and signs discussed in detail.  Patient expressed understanding regarding what to do in case of emergency\urgent symptoms  Please see AVS handed out to patient at the end of our visit for further patient instructions/ counseling done pertaining to today's office visit.   Return for 2)8wks with FBW 1 wk prior.     Note:  This note was prepared with assistance of Dragon voice recognition software. Occasional wrong-word or sound-a-like substitutions may have occurred due to the inherent limitations of voice recognition software.   This document serves as a record of services personally performed by Thomasene Lot, DO. It was created on her behalf by Peggye Fothergill, a trained medical scribe. The creation of this record is based on the  scribe's personal observations and the provider's statements to them.   I have reviewed the above medical documentation for accuracy and completeness and I concur.  Thomasene Lot, DO 03/05/2018 11:44 AM       -------------------------------------------------------------------------------------------------------------------------------------    Subjective:     HPI: Mary Shaffer is a 52 y.o. female who presents to [facility] today for issues as discussed below.  States she is having RA flare ups every other day. "I've been eating ibuprofen."  Sometimes she takes 4 ibuprofen at a time, and 6 hours later, she takes 4 more.  Says that on days she feels fine, she doesn't take anything.  Notes the flares occur mainly in her hands and feet, and joints.  "It's the tops of my feet, not the bottom; it's the little bones in there that feel like they're on fire."  Admits that food does aggravate her RA, including red meat and sugar.  Comments that the medicine she received from her Rheumatologist "makes me worse."  Says it "either makes me sick and they have to give me  phenergan with it, which knocks me out and I don't have strength or energy for three days, or it gives me a lot of flare ups."  Feels that this last month has been particularly horrible, with a flare up every other day.  In the past, she was seeing Dr. Corliss Skains.  Switched to [employer], but hasn't seen a rheumatologist in a while.  Notes she liked Marylene Land, but that her care transitioned to a PA and she did not like the PA as much.  Has not been to see Dr. Nickola Major in a while.  States "Marylene Land had me taking 8 methotrexate, 2 folic acid, and phenergan."    Wt Readings from Last 3 Encounters:  03/04/18 199 lb 3.2 oz (90.4 kg)  07/01/17 199 lb 11.2 oz (90.6 kg)  06/12/17 205 lb (93 kg)   BP Readings from Last 3 Encounters:  03/04/18 111/77  07/01/17 114/81  06/12/17 118/82   Pulse Readings  from Last 3 Encounters:  03/04/18 73  07/01/17 80  06/12/17 69   BMI Readings from Last 3 Encounters:  03/04/18 34.19 kg/m  07/01/17 34.28 kg/m  06/12/17 34.11 kg/m     Patient Care Team    Relationship Specialty Notifications Start End  Thomasene Lot, DO PCP - General Family Medicine  08/17/15   Richardean Chimera, MD Consulting Physician Obstetrics and Gynecology  06/13/17      Patient Active Problem List   Diagnosis Date Noted  . RLS (restless legs syndrome) 08/27/2016    Priority: High  . Prediabetes\ glucose intolerance 04/10/2016    Priority: High  . 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 Overweight (BMI 25.0-29.9) 08/17/2015    Priority: High  . Rheumatoid arthritis (HCC) 08/17/2015    Priority: High  . History of hysterectomy - benign reasons 10/01/2015    Priority: Medium  . Chronic pain syndrome 09/26/2015    Priority: Medium  . Muscle pain, myofacial 01/08/2007    Priority: Medium  . Vitamin D deficiency 10/01/2015    Priority: Low  . Continuous tobacco abuse 10/01/2015    Priority: Low  . Tobacco abuse 08/17/2015    Priority: Low  . Tobacco abuse counseling 08/17/2015    Priority: Low  . Obesity (BMI 30-39.9) 06/12/2017  . Counseling on health promotion and disease prevention 07/30/2016  . Dysesthesia affecting both sides of body 04/10/2016  . Encounter for weight loss counseling 03/30/2016  . Arthritis 10/01/2015  . CONTACT DERMATITIS&OTHER ECZEMA DUE UNSPEC CAUSE 03/24/2009    Past Medical history, Surgical history, Family history, Social history, Allergies and Medications have been entered into the medical record, reviewed and changed as needed.    Current Meds  Medication Sig  . Cholecalciferol (VITAMIN D3) 5000 units TABS Take 1 tablet by mouth daily.  Marland Kitchen ibuprofen (ADVIL,MOTRIN) 200 MG tablet Take 400 mg by mouth every 6 (six) hours as needed.    Allergies:  Allergies  Allergen Reactions  .  Codeine Sulfate     REACTION: Rash and nausea  . Sulfa Antibiotics Itching    And hives     Review of Systems:  A fourteen system review of systems was performed and found to be positive as per HPI.   Objective:   Blood pressure 111/77, pulse 73, temperature 98 F (36.7 C), height 5\' 4"  (1.626 m), weight 199 lb 3.2 oz (90.4 kg), SpO2 99 %. Body mass index is 34.19 kg/m. General:  Well Developed, well nourished, appropriate  for stated age.  Neuro:  Alert and oriented,  extra-ocular muscles intact  HEENT:  Normocephalic, atraumatic, neck supple, no carotid bruits appreciated  Skin:  no gross rash, warm, pink. Cardiac:  RRR, S1 S2 Respiratory:  ECTA B/L and A/P, Not using accessory muscles, speaking in full sentences- unlabored. Vascular:  Ext warm, no cyanosis apprec.; cap RF less 2 sec. Psych:  No HI/SI, judgement and insight good, Euthymic mood. Full Affect.

## 2018-04-28 ENCOUNTER — Other Ambulatory Visit: Payer: 59

## 2018-05-05 ENCOUNTER — Ambulatory Visit: Payer: 59 | Admitting: Family Medicine

## 2019-07-08 ENCOUNTER — Encounter: Payer: Self-pay | Admitting: Family Medicine

## 2019-07-08 ENCOUNTER — Other Ambulatory Visit: Payer: Self-pay

## 2019-07-08 ENCOUNTER — Telehealth (INDEPENDENT_AMBULATORY_CARE_PROVIDER_SITE_OTHER): Payer: 59 | Admitting: Family Medicine

## 2019-07-08 VITALS — Ht 65.0 in | Wt 200.0 lb

## 2019-07-08 DIAGNOSIS — R05 Cough: Secondary | ICD-10-CM | POA: Diagnosis not present

## 2019-07-08 DIAGNOSIS — F17210 Nicotine dependence, cigarettes, uncomplicated: Secondary | ICD-10-CM

## 2019-07-08 DIAGNOSIS — J3089 Other allergic rhinitis: Secondary | ICD-10-CM

## 2019-07-08 DIAGNOSIS — J019 Acute sinusitis, unspecified: Secondary | ICD-10-CM | POA: Diagnosis not present

## 2019-07-08 DIAGNOSIS — Z720 Tobacco use: Secondary | ICD-10-CM

## 2019-07-08 DIAGNOSIS — R062 Wheezing: Secondary | ICD-10-CM

## 2019-07-08 DIAGNOSIS — R059 Cough, unspecified: Secondary | ICD-10-CM

## 2019-07-08 MED ORDER — PREDNISONE 20 MG PO TABS: ORAL_TABLET | ORAL | 0 refills | Status: AC

## 2019-07-08 MED ORDER — ALBUTEROL SULFATE HFA 108 (90 BASE) MCG/ACT IN AERS: 1.0000 | INHALATION_SPRAY | RESPIRATORY_TRACT | 0 refills | Status: AC | PRN

## 2019-07-08 MED ORDER — MONTELUKAST SODIUM 10 MG PO TABS: 10.0000 mg | ORAL_TABLET | Freq: Every day | ORAL | 1 refills | Status: AC

## 2019-07-08 MED ORDER — LEVOCETIRIZINE DIHYDROCHLORIDE 5 MG PO TABS: 5.0000 mg | ORAL_TABLET | Freq: Every evening | ORAL | 1 refills | Status: AC

## 2019-07-08 NOTE — Progress Notes (Signed)
Telehealth office visit note for Mary Shaffer, D.O- at Primary Care at [facility]   I connected with current patient today and verified that I am speaking with the correct person   . Location of the patient: Home . Location of the provider: Office - This visit type was conducted due to national recommendations for restrictions regarding the COVID-19 Pandemic (e.g. social distancing) in an effort to limit this patient's exposure and mitigate transmission in our community.    - No physical exam could be performed with this format, beyond that communicated to [place] by the patient/ family members as noted.   - Additionally my office staff/ schedulers were to discuss with the patient that there may be a monetary charge related to this service, depending on their medical insurance.  My understanding is that patient understood and consented to proceed.     _________________________________________________________________________________    History of Present Illness:  I, Toni Amend, am serving as Education administrator for [employer].  - Acute URI / Allergy Symptoms Recently, she was helping clear wood / trees, with a lot of exposure to pollen and dust.    She began feeling symptoms and started taking Zyrtec over the last couple of nights to treat them.  Notes for a bit, she was feeling better, but then yesterday she started coughing terribly.  Her symptoms started last Thursday night, about seven days ago.  Felt she was getting better, and then "last night, it was really bad, with the coughing and everything."  Her face hurt a little bit on Saturday with all the "nasaly stuff," but this face pain went away after a couple of days.  She currently feels chest tightness, and feels she has a lot of congestion.  Feels like she cannot get the congestion to break up so she can cough it up.  The coughing has made her chest really sore, "like right between my boobs."  Says she doesn't feel SOB until  "I have one of these coughing fits."  Says "other than that, no."  "I just have all this congestion that I can't get rid of."  Says last night, she was wheezing, and coughing due to her congestion and wheeze.  Says the wheezing was so bad last night, "I thought I had the death rattle."  Denies fever/chills.  Regarding possible COVID-19 exposure, says she cannot think of anyone she might've been exposed to.  Notes she's "really good about wearing my mask when I go out; I wear my mask any store I go into."  Says "I normally do not have trouble with allergies."  - Current Smoker She continues smoking.  Notes "I haven't had anything over the last 24 hours."  Says when her lungs feel the way they do now, she doesn't smoke.    GAD 7 : Generalized Anxiety Score 08/27/2016  Nervous, Anxious, on Edge 0  Control/stop worrying 0  Worry too much - different things 0  Trouble relaxing 0  Restless 2  Easily annoyed or irritable 2  Afraid - awful might happen 0  Total GAD 7 Score 4  Anxiety Difficulty Not difficult at all    Depression screen [facility] 2/9 07/08/2019 03/04/2018 07/01/2017 06/12/2017 08/27/2016  Decreased Interest 0 1 0 1 0  Down, Depressed, Hopeless 0 1 0 2 0  PHQ - 2 Score 0 2 0 3 0  Altered sleeping 1 1 0 1 1  Tired, decreased energy 0 1 1 1 1   Change  in appetite 1 1 0 3 0  Feeling bad or failure about yourself  0 0 0 1 0  Trouble concentrating 0 0 0 1 0  Moving slowly or fidgety/restless 0 0 0 1 0  Suicidal thoughts 0 0 0 0 0  PHQ-9 Score 2 5 1 11 2   Difficult doing work/chores Not difficult at all Very difficult Not difficult at all Not difficult at all -      Impression and Recommendations:     1. Acute sinusitis, recurrence not specified, unspecified location   2. Environmental and seasonal allergies   3. Wheezing   4. Cough- due to post-nasal drip   5. Continuous tobacco abuse      - Patient was last seen fo OV in 12 of 2019.  At that time, patient was told to  follow up in 8 weeks, with fasting blood work prior, to re-check rheumatoid arthritis, aches, and pains.  Patient was lost to follow-up.  - Last labs obtained in 6 of 2017.  Acute Sinusitis - Environmental & Seasonal Allergies, Wheezing, Cough due to post-nasal drip - Discussed allergy symptoms extensively with patient today.  - Steroid taper prescribed today.  See med list.  - Begin Xyzal nightly every evening for seasonal allergies. - Advised patient to take Xyzal daily during allergy season for now.  - Begin Singulair.  See med list.  - Discussed importance of continuing all medications as prescribed for preventative maintenance.  - For acute relief, albuterol inhaler provided.  See med list.  - For acute management of cough, advised patient to look into Mucinex DM or Nyquil OTC.  - Will continue to monitor. - Patient knows she may re-start treatment plan in fall as well if needed.   Continuous tobacco abuse  - After she recovers from her acute symptoms, encouraged patient to continue to work on smoking cessation. - Will continue to monitor.   Recommendations - Need for CPE and full fasting blood work same day near future.     - As part of my medical decision making, I reviewed the following data within the electronic MEDICAL RECORD NUMBER History obtained from pt /family, CMA notes reviewed and incorporated if applicable, Labs reviewed, Radiograph/ tests reviewed if applicable and OV notes from prior OV's with me, as well as other specialists she/he has seen since seeing me last, were all reviewed and used in my medical decision making process today.    - Additionally, when appropriate, discussion had with patient regarding our treatment plan, and their biases/concerns about that plan were used in my medical decision making today.    - The patient agreed with the plan and demonstrated an understanding of the instructions.   No barriers to understanding were identified.     -  The patient was advised to call back or seek an in-person evaluation if the symptoms worsen or if the condition fails to improve as anticipated.   Return for CPE and FBW same day in the VERY near future- pt knows it NEEDS to be done!!.     Meds ordered this encounter  Medications  . montelukast (SINGULAIR) 10 MG tablet    Sig: Take 1 tablet (10 mg total) by mouth at bedtime. For seasonal all    Dispense:  90 tablet    Refill:  1  . predniSONE (DELTASONE) 20 MG tablet    Sig: Take 3 pills a day for 2 days, 2 pills a day for 2 days, 1 pill a day  for 2 days then one half pill a day for 2 days then off    Dispense:  14 tablet    Refill:  0  . levocetirizine (XYZAL) 5 MG tablet    Sig: Take 1 tablet (5 mg total) by mouth every evening. For seasonal all    Dispense:  90 tablet    Refill:  1  . albuterol (VENTOLIN HFA) 108 (90 Base) MCG/ACT inhaler    Sig: Inhale 1-2 puffs into the lungs every 4 (four) hours as needed for wheezing or shortness of breath.    Dispense:  16 g    Refill:  0    Medications Discontinued During This Encounter  Medication Reason  . predniSONE (DELTASONE) 20 MG tablet       Time spent on visit including pre-visit chart review and post-visit care was 17 minutes.   Note:  This note was prepared with assistance of Dragon voice recognition software. Occasional wrong-word or sound-a-like substitutions may have occurred due to the inherent limitations of voice recognition software.  The 21st Century Cures Act was signed into law in 2016 which includes the topic of electronic health records.  This provides immediate access to information in MyChart.  This includes consultation notes, operative notes, office notes, lab results and pathology reports.  If you have any questions about what you read please let us know at your next visit or call us at the office.  We are right here with you.  This document serves as a record of services personally performed by Thomasene Lot, DO. It was created on her behalf by Peggye Fothergill, a trained medical scribe. The creation of this record is based on the scribe's personal observations and the provider's statements to them.    The above documentation from Peggye Fothergill, medical scribe, has been reviewed by Carlye Grippe, D.O.    __________________________________________________________________________________     Patient Care Team    Relationship Specialty Notifications Start End  Thomasene Lot, DO PCP - General Family Medicine  08/17/15   Richardean Chimera, MD Consulting Physician Obstetrics and Gynecology  06/13/17      -Vitals obtained; medications/ allergies reconciled;  personal medical, social, Sx etc.histories were updated by CMA, reviewed by me and are reflected in chart   Patient Active Problem List   Diagnosis Date Noted  . RLS (restless legs syndrome) 08/27/2016  . Prediabetes\ glucose intolerance 04/10/2016  . Adjustment disorder with mixed emotions/ depressed mood 10/01/2015  . Overweight (BMI 25.0-29.9) 08/17/2015  . Rheumatoid arthritis (HCC) 08/17/2015  . History of hysterectomy - benign reasons 10/01/2015  . Chronic pain syndrome 09/26/2015  . Muscle pain, myofacial 01/08/2007  . Vitamin D deficiency 10/01/2015  . Continuous tobacco abuse 10/01/2015  . Tobacco abuse 08/17/2015  . Tobacco abuse counseling 08/17/2015  . Obesity (BMI 30-39.9) 06/12/2017  . Counseling on health promotion and disease prevention 07/30/2016  . Dysesthesia affecting both sides of body 04/10/2016  . Encounter for weight loss counseling 03/30/2016  . Arthritis 10/01/2015  . CONTACT DERMATITIS&OTHER ECZEMA DUE UNSPEC CAUSE 03/24/2009     Current Meds  Medication Sig  . Ascorbic Acid (VITAMIN C) 100 MG tablet Take 500 mg by mouth daily.  . cetirizine (ZYRTEC) 10 MG tablet Take 10 mg by mouth daily.  . Cholecalciferol (VITAMIN D3) 5000 units TABS Take 1 tablet by mouth daily.  Marland Kitchen ibuprofen  (ADVIL,MOTRIN) 200 MG tablet Take 400 mg by mouth every 6 (six) hours as needed.     Allergies:  Allergies  Allergen Reactions  . Codeine Sulfate     REACTION: Rash and nausea  . Sulfa Antibiotics Itching    And hives     ROS:  See above HPI for pertinent positives and negatives   Objective:   Height 5\' 5"  (1.651 m), weight 200 lb (90.7 kg).  (if some vitals are omitted, this means that patient was UNABLE to obtain them even though they were asked to get them prior to OV today.  They were asked to call at their earliest convenience with these once obtained. ) General: A & O * 3; sounds in no acute distress; in usual state of health.  Skin: Pt confirms warm and dry extremities and pink fingertips HEENT: Pt confirms lips non-cyanotic Chest: Patient confirms normal chest excursion and movement Respiratory: speaking in full sentences, no conversational dyspnea; patient confirms no use of accessory muscles Psych: insight appears good, mood- appears full
